# Patient Record
Sex: Male | Born: 1975 | Race: White | Hispanic: No | Smoking: Never smoker
Health system: Southern US, Community
[De-identification: ages and names within clinical notes are randomized; demographics above are authoritative.]

## PROBLEM LIST (undated history)

## (undated) DIAGNOSIS — G4700 Insomnia, unspecified: Secondary | ICD-10-CM

## (undated) DIAGNOSIS — F419 Anxiety disorder, unspecified: Secondary | ICD-10-CM

## (undated) DIAGNOSIS — I10 Essential (primary) hypertension: Secondary | ICD-10-CM

## (undated) DIAGNOSIS — F32A Depression, unspecified: Secondary | ICD-10-CM

## (undated) HISTORY — DX: Anxiety disorder, unspecified: F41.9

## (undated) HISTORY — DX: Essential (primary) hypertension: I10

## (undated) HISTORY — DX: Insomnia, unspecified: G47.00

## (undated) HISTORY — DX: Depression, unspecified: F32.A

---

## 2010-03-06 ENCOUNTER — Emergency Department (HOSPITAL_COMMUNITY)
Admission: EM | Admit: 2010-03-06 | Discharge: 2010-03-06 | Payer: Self-pay | Source: Home / Self Care | Admitting: Family Medicine

## 2010-03-16 ENCOUNTER — Inpatient Hospital Stay (INDEPENDENT_AMBULATORY_CARE_PROVIDER_SITE_OTHER)
Admission: RE | Admit: 2010-03-16 | Discharge: 2010-03-16 | Disposition: A | Discharge status: Home / Self Care | Payer: 59 | Source: Ambulatory Visit | Attending: Family Medicine | Admitting: Family Medicine

## 2010-03-16 DIAGNOSIS — Z0489 Encounter for examination and observation for other specified reasons: Secondary | ICD-10-CM

## 2011-11-21 IMAGING — CR DG NASAL BONES 3+V
3 series · 3 of 3 positions shown · non-contrast
Comparison: None.

CLINICAL DATA: Fell - laceration to nose

NASAL BONES - 3+ VIEW

[view not recorded (1 of 3)]
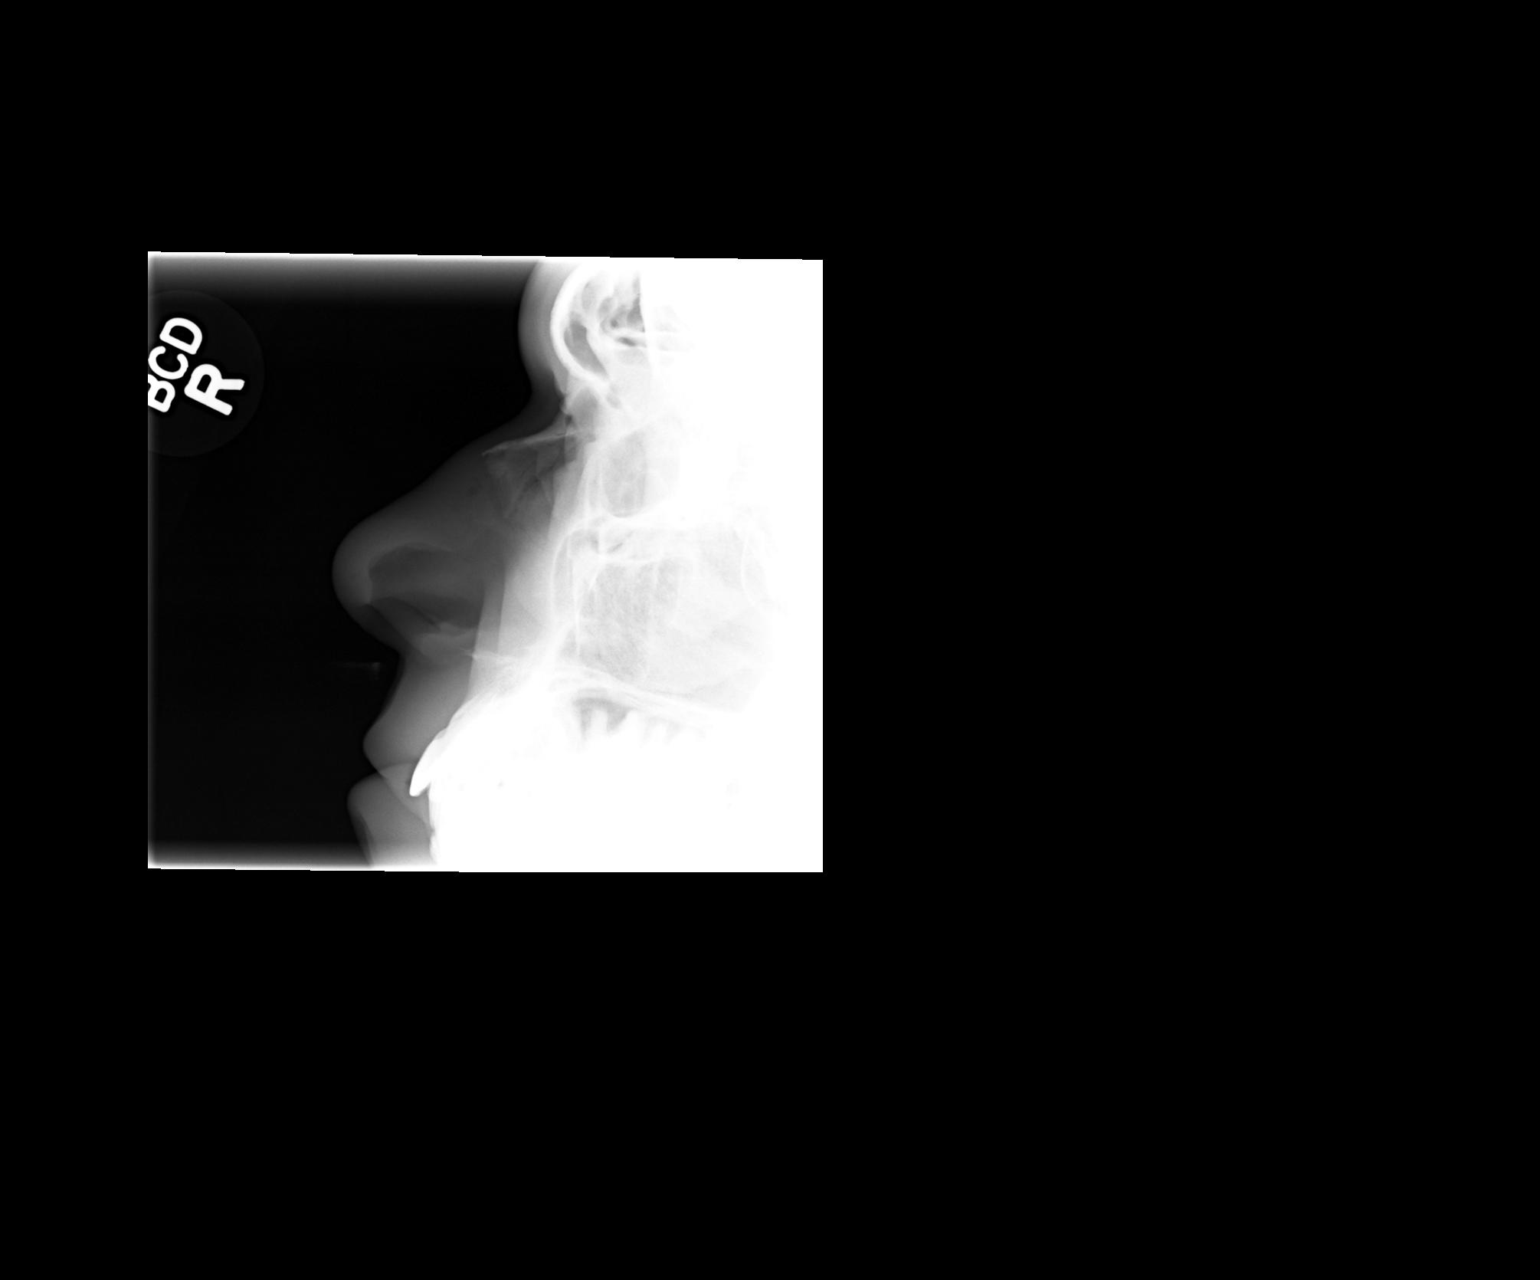

[view not recorded (2 of 3)]
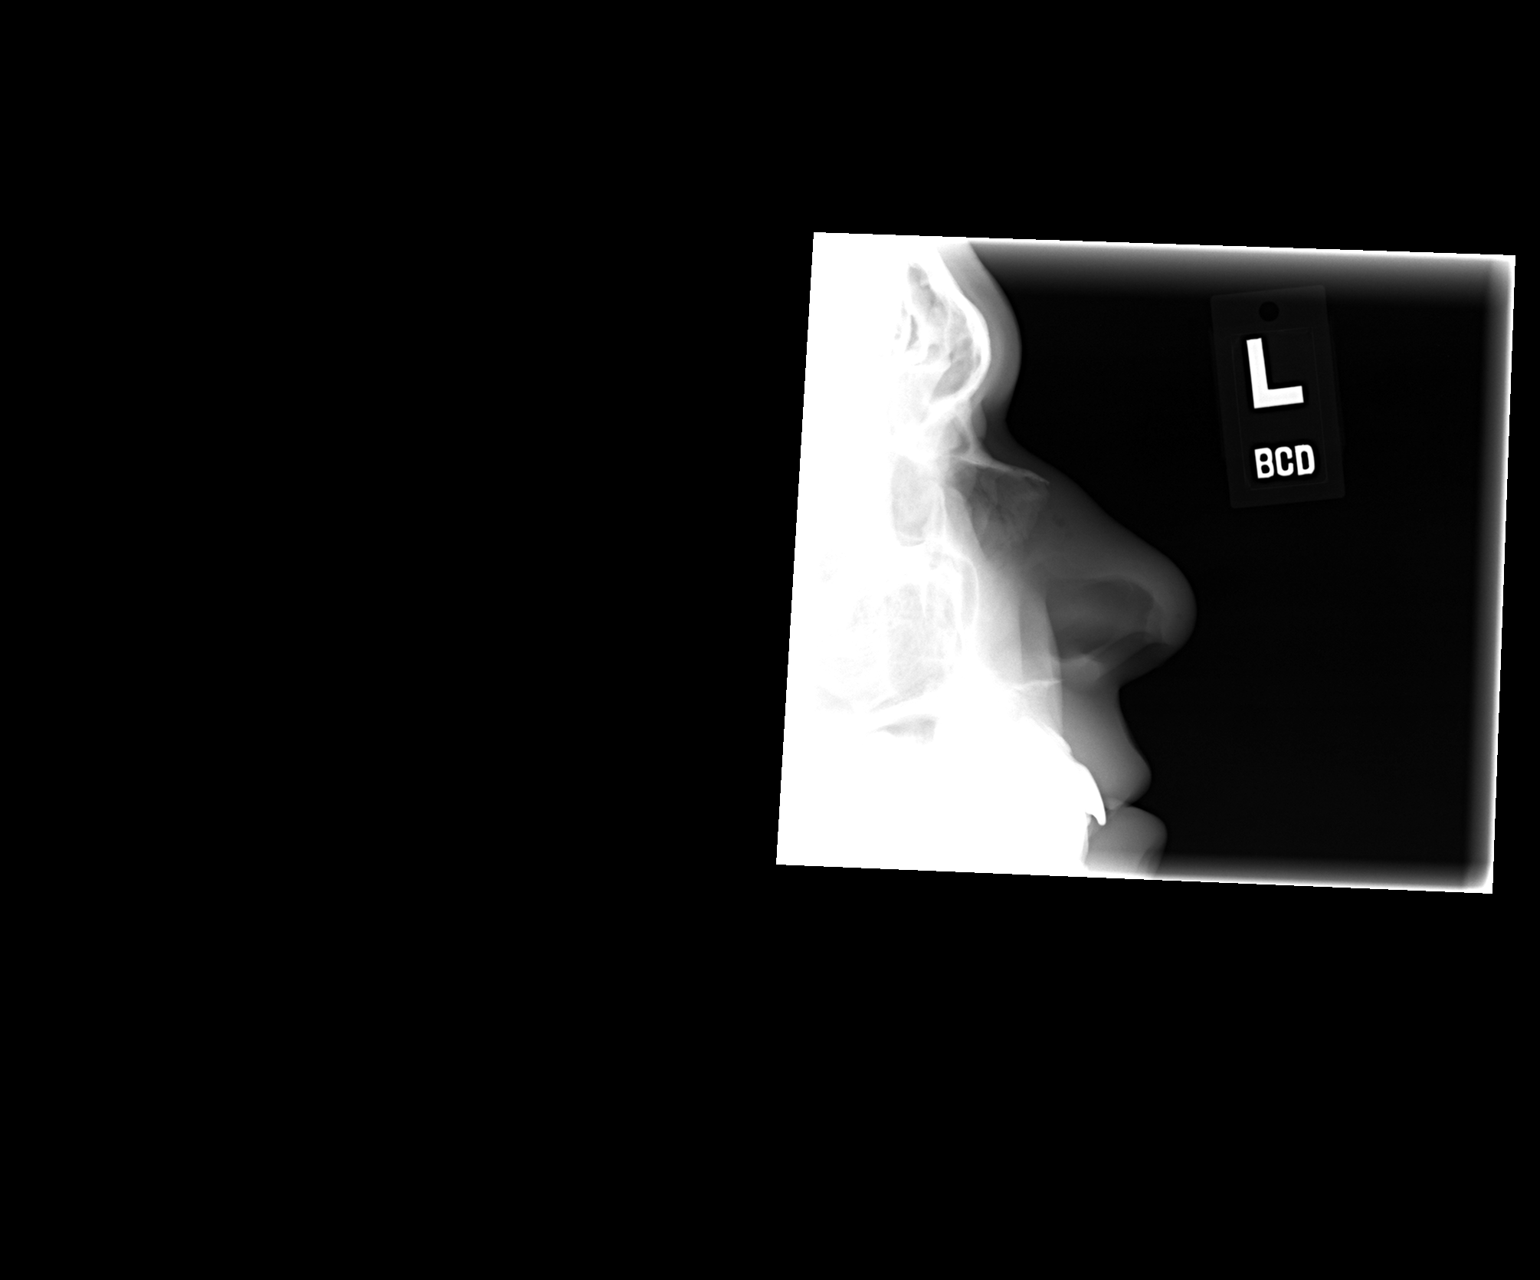

[view not recorded (3 of 3)]
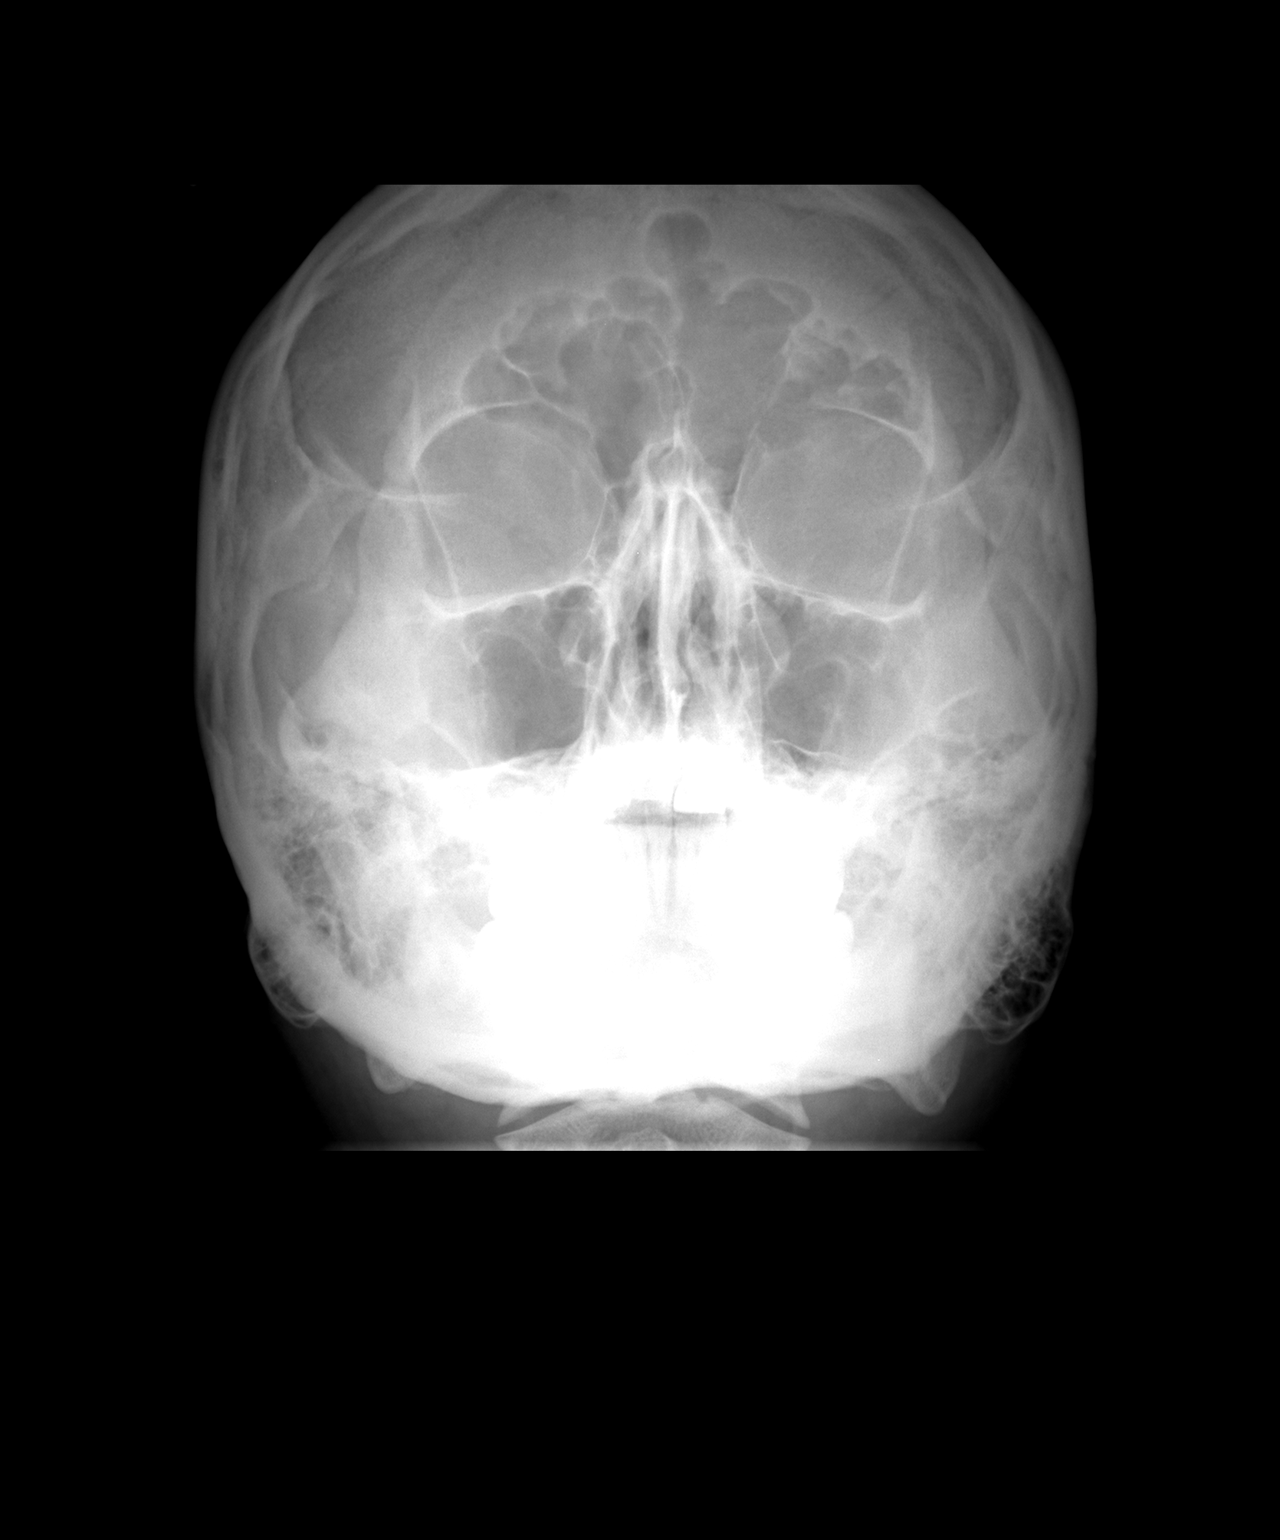

[3 of 3 positions shown; findings below may reference images not displayed]

FINDINGS: No nasal fracture.  No fluid in the sinuses.
IMPRESSION: No acute or significant findings.

## 2014-05-27 ENCOUNTER — Ambulatory Visit (INDEPENDENT_AMBULATORY_CARE_PROVIDER_SITE_OTHER): Payer: 59

## 2014-05-27 ENCOUNTER — Ambulatory Visit (INDEPENDENT_AMBULATORY_CARE_PROVIDER_SITE_OTHER): Payer: 59 | Admitting: Physician Assistant

## 2014-05-27 VITALS — BP 122/80 | HR 81 | Temp 97.8°F | Resp 20 | Ht 74.5 in | Wt 230.4 lb

## 2014-05-27 DIAGNOSIS — R05 Cough: Secondary | ICD-10-CM

## 2014-05-27 DIAGNOSIS — R059 Cough, unspecified: Secondary | ICD-10-CM

## 2014-05-27 DIAGNOSIS — R0989 Other specified symptoms and signs involving the circulatory and respiratory systems: Secondary | ICD-10-CM

## 2014-05-27 DIAGNOSIS — J302 Other seasonal allergic rhinitis: Secondary | ICD-10-CM | POA: Diagnosis not present

## 2014-05-27 LAB — POCT CBC
GRANULOCYTE PERCENT: 84.3 % — AB (ref 37–80)
HEMATOCRIT: 48.6 % (ref 43.5–53.7)
Hemoglobin: 16.3 g/dL (ref 14.1–18.1)
LYMPH, POC: 0.9 (ref 0.6–3.4)
MCH, POC: 32.3 pg — AB (ref 27–31.2)
MCHC: 33.5 g/dL (ref 31.8–35.4)
MCV: 96.3 fL (ref 80–97)
MID (CBC): 0.3 (ref 0–0.9)
MPV: 7.4 fL (ref 0–99.8)
PLATELET COUNT, POC: 230 10*3/uL (ref 142–424)
POC Granulocyte: 6.4 (ref 2–6.9)
POC LYMPH %: 12.2 % (ref 10–50)
POC MID %: 3.5 %M (ref 0–12)
RBC: 5.05 M/uL (ref 4.69–6.13)
RDW, POC: 14.2 %
WBC: 7.6 10*3/uL (ref 4.6–10.2)

## 2014-05-27 MED ORDER — GUAIFENESIN ER 1200 MG PO TB12: 1.0000 | ORAL_TABLET | Freq: Two times a day (BID) | ORAL | Status: DC | PRN

## 2014-05-27 MED ORDER — ALBUTEROL SULFATE HFA 108 (90 BASE) MCG/ACT IN AERS: 2.0000 | INHALATION_SPRAY | RESPIRATORY_TRACT | Status: DC | PRN

## 2014-05-27 MED ORDER — ALBUTEROL SULFATE (2.5 MG/3ML) 0.083% IN NEBU
2.5000 mg | INHALATION_SOLUTION | Freq: Once | RESPIRATORY_TRACT | Status: AC
  Administered 2014-05-27: 2.5 mg via RESPIRATORY_TRACT

## 2014-05-27 MED ORDER — HYDROCOD POLST-CPM POLST ER 10-8 MG/5ML PO SUER: 5.0000 mL | Freq: Every evening | ORAL | Status: AC | PRN

## 2014-05-27 NOTE — Patient Instructions (Signed)
Please continue to hydrate. Let us know if your symptoms do not improve in one week. Your breathing was much better with the albuterol which is why we are giving you an inhaler today.

## 2014-05-27 NOTE — Progress Notes (Signed)
[facility] [address], [place] [address] [phone]  Date:  05/27/2014   Name:  Matthew KeensDaniel Brining   DOB:  May 18, 1975   MRN:  [id number]  PCP:  Colette RibasGOLDING, JOHN CABOT, MD    Chief Complaint: Cough and Chills   History of Present Illness:  Matthew KeensDaniel Mcinerny is a 39 y.o. very pleasant male patient who presents with the following:  Patient reports that he began to have a cough that has progressively worsened last week.  The cough was productive yellowish-brown.  He also has congestion and sinus pressure.  He became concerned when yesterday he considerable dyspnea with walking up a flight of stairs.  This is not associated with chest pains, palpitations, dizziness, or diaphoresis.  He has some throat pain secondary to cough.  He notes that 1 month ago he had a cough and was given a Zpak, but only took 3 pills.  He denies nausea, vomiting, or diarrhea.  He has tried robitussin, contact cold and flu, mucinex.  Water intake 64oz per day.  He notes that he has been responding to the pollen and seasonal allergies in recent years.  There are no active problems to display for this patient.   Past Medical History  Diagnosis Date  . Anxiety   . Hypertension     History reviewed. No pertinent past surgical history.  History  Substance Use Topics  . Smoking status: Never Smoker   . Smokeless tobacco: Not on file  . Alcohol Use: Not on file    Family History  Problem Relation Age of Onset  . Diabetes Father   . Heart disease Father   . Hyperlipidemia Father     No Known Allergies  Medication list has been reviewed and updated.  No current outpatient prescriptions on file prior to visit.   No current facility-administered medications on file prior to visit.    Review of Systems: ROS otherwise unremarkable unless listed above.    Physical Examination: Filed Vitals:   05/27/14 1241  BP: 122/80  Pulse: 81  Temp: 97.8 F (36.6 C)  Resp: 20   Filed  Vitals:   05/27/14 1241  Height: 6' 2.5" (1.892 m)  Weight: 230 lb 6.4 oz (104.509 kg)   Body mass index is 29.2 kg/(m^2). Ideal Body Weight: Weight in (lb) to have BMI = 25: 196.9  Physical Exam  Constitutional: He appears well-developed and well-nourished. No distress.  HENT:  Head: Normocephalic and atraumatic.  Right Ear: External ear normal.  Nose: Mucosal edema and rhinorrhea present. Right sinus exhibits no maxillary sinus tenderness and no frontal sinus tenderness. Left sinus exhibits no maxillary sinus tenderness and no frontal sinus tenderness.  Mouth/Throat: No oropharyngeal exudate or posterior oropharyngeal edema.  Eyes: Conjunctivae and EOM are normal. Pupils are equal, round, and reactive to light. Right eye exhibits no discharge. Left eye exhibits no discharge.  Neck: Normal range of motion.  Cardiovascular: Normal rate and regular rhythm.  Exam reveals no gallop, no distant heart sounds and no friction rub.   No murmur heard. Pulmonary/Chest: No apnea. No respiratory distress. He has no decreased breath sounds. He has no wheezes. He has no rhonchi.  Skin: He is not diaphoretic.   Pre-nebulizing treatment: 630, predicted value 659 Post nebulizing treatment: 700  [facility] reading (PRIMARY) by  Dr. Cleta Albertsaub: Please comment on lateral of nodular density.  Also air trapping at anterior.    Assessment and Plan: 39 year old male is here today for  chief complaint of cough and chills. Likely that this is seasonal allergies that have prompted airway inflammation.  I am suggesting he start an albuterol inhaler.  He will start an allergy medication.  Supportive treatment for cough.    Cough - Plan: DG Chest 2 View, POCT CBC, chlorpheniramine-HYDROcodone (TUSSIONEX PENNKINETIC ER) 10-8 MG/5ML SUER  Seasonal allergies - Plan: albuterol (PROVENTIL HFA;VENTOLIN HFA) 108 (90 BASE) MCG/ACT inhaler  Chest congestion - Plan: Guaifenesin (MUCINEX MAXIMUM STRENGTH) 1200 MG TB12, albuterol  (PROVENTIL) (2.5 MG/3ML) 0.083% nebulizer solution 2.5 mg  Trena Platt, PA-C [facility] 4/18/20168:48 AM

## 2014-06-02 ENCOUNTER — Telehealth: Payer: Self-pay

## 2014-06-02 MED ORDER — AZITHROMYCIN 250 MG PO TABS: ORAL_TABLET | ORAL | Status: DC

## 2014-06-02 NOTE — Telephone Encounter (Signed)
As his symptoms have persisted for awhile now it is reasonable to treat with an antibiotic. Sent in ZPak. Take 2 pills on day 1 then one pill per day for 4 days. He needs to come in to be seen if his symptoms persist.

## 2014-06-02 NOTE — Telephone Encounter (Signed)
Patient says he was advised if his symptoms persisted that an antibiotic would be rx. He is still have the same symptoms

## 2014-06-02 NOTE — Telephone Encounter (Signed)
lmom to cb. 

## 2014-06-02 NOTE — Telephone Encounter (Signed)
Assessment and Plan: 39 year old male is here today for chief complaint of cough and chills. Likely that this is seasonal allergies that have prompted airway inflammation. I am suggesting he start an albuterol inhaler. He will start an allergy medication. Supportive treatment for cough.   Cough - Plan: DG Chest 2 View, POCT CBC, chlorpheniramine-HYDROcodone (TUSSIONEX PENNKINETIC ER) 10-8 MG/5ML SUER  Seasonal allergies - Plan: albuterol (PROVENTIL HFA;VENTOLIN HFA) 108 (90 BASE) MCG/ACT inhaler  Chest congestion - Plan: Guaifenesin (MUCINEX MAXIMUM STRENGTH) 1200 MG TB12, albuterol (PROVENTIL) (2.5 MG/3ML) 0.083% nebulizer solution 2.5 mg  I don't see in the plan that this was discussed. Please advise.

## 2014-06-03 NOTE — Telephone Encounter (Signed)
Left message letting pt know. 

## 2014-08-31 ENCOUNTER — Telehealth: Payer: Self-pay

## 2014-08-31 NOTE — Telephone Encounter (Signed)
Records have been received and are currently in [place] English, PA-C's box for review. Patient states he will call back at another time to schedule an appt.

## 2014-08-31 NOTE — Telephone Encounter (Signed)
Patient left voicemail today at 12:17pm stating he dropped off a ROI form about a week ago and wants to know if we have received records so he can set up an appt. Cb# [phone].

## 2014-09-01 ENCOUNTER — Ambulatory Visit (INDEPENDENT_AMBULATORY_CARE_PROVIDER_SITE_OTHER): Payer: 59 | Admitting: Physician Assistant

## 2014-09-01 VITALS — BP 142/100 | HR 76 | Temp 98.9°F | Resp 18 | Ht 73.5 in | Wt 227.0 lb

## 2014-09-01 DIAGNOSIS — F411 Generalized anxiety disorder: Secondary | ICD-10-CM | POA: Diagnosis not present

## 2014-09-01 DIAGNOSIS — F329 Major depressive disorder, single episode, unspecified: Secondary | ICD-10-CM | POA: Diagnosis not present

## 2014-09-01 DIAGNOSIS — F419 Anxiety disorder, unspecified: Secondary | ICD-10-CM | POA: Insufficient documentation

## 2014-09-01 DIAGNOSIS — I10 Essential (primary) hypertension: Secondary | ICD-10-CM | POA: Diagnosis not present

## 2014-09-01 DIAGNOSIS — F32A Depression, unspecified: Secondary | ICD-10-CM | POA: Insufficient documentation

## 2014-09-01 MED ORDER — BUPROPION HCL ER (SR) 100 MG PO TB12: 100.0000 mg | ORAL_TABLET | Freq: Every day | ORAL | Status: DC

## 2014-09-01 MED ORDER — NEBIVOLOL HCL 2.5 MG PO TABS: 5.0000 mg | ORAL_TABLET | Freq: Every day | ORAL | Status: DC

## 2014-09-01 MED ORDER — DIAZEPAM 5 MG PO TABS: 5.0000 mg | ORAL_TABLET | Freq: Four times a day (QID) | ORAL | Status: DC | PRN

## 2014-09-01 NOTE — Progress Notes (Signed)
   Subjective:    Patient ID: Matthew Hanson, male    DOB: 03/01/1975, 39 y.o.   MRN: [id number]  Chief Complaint  Patient presents with  . Medication Refills    Wellbutrin, Valium, and Bystolic. Just switched [place] to PCP   Patient Active Problem List   Diagnosis Date Noted  . Essential hypertension 09/01/2014  . Anxiety state 09/01/2014  . Depression 09/01/2014   Medications, allergies, past medical history, surgical history, family history, social history and problem list reviewed and updated.  HPI  18 yom presents needing refills.   Has been in [place] 2 yrs. His pcp just cut their weekend hours so he is transferring his care to [place].   Wellbutrin - Has been on for approx one year for depression. Used to take celexa but thought this made him feel drowsy and distant. Has felt depressed over past couple yrs due to stressful job and going through divorce. Takes qd in am.   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 Stressful. Takes prn. Sometimes not at all. Sometimes bid. Has not taken in one month.   Bystolic - BP mildly elevated today, normal last visit to clinic. Does not check at home. Had been running 130/80s at old pcp. Was exercising but has stopped recently. Not watching salt in diet.   Review of Systems No cp, sob, vision changes, palps, persistent HAs.     Objective:   Physical Exam  Constitutional: He appears well-developed and well-nourished.  Non-toxic appearance. He does not have a sickly appearance. He does not appear ill. No distress.  BP 142/100 mmHg  Pulse 76  Temp(Src) 98.9 F (37.2 C) (Oral)  Resp 18  Ht 6' 1.5" (1.867 m)  Wt 227 lb (102.967 kg)  BMI 29.54 kg/m2  SpO2 98%       Assessment & Plan:   Essential hypertension - Plan: nebivolol (BYSTOLIC) 2.5 MG tablet  Anxiety state - Plan: diazepam (VALIUM) 5 MG tablet  Depression - Plan: buPROPion (WELLBUTRIN SR) 100 MG 12 hr tablet --meds  filled for 3 month supply --for further refills pt encouraged to schedule a cpe at 104 when he knows his schedule, this will help to be established in the practice and to establish a pcp with [facility], pt agreeable --greater than 25 minutes spent with pt discussing his chronic conditions for which he takes his medications  Donnajean Lopes, PA-C Physician Assistant-Certified [facility]  09/01/2014 8:11 PM

## 2014-09-01 NOTE — Patient Instructions (Signed)
I've refilled your medications for 3 months. For further refills we would like to see you in the building next door ([address]) for a complete physical exam in 3 months. Please give [place] a call to schedule this ahead of time.

## 2014-11-28 ENCOUNTER — Other Ambulatory Visit: Payer: Self-pay

## 2014-11-28 DIAGNOSIS — I10 Essential (primary) hypertension: Secondary | ICD-10-CM

## 2014-11-28 MED ORDER — NEBIVOLOL HCL 5 MG PO TABS: 5.0000 mg | ORAL_TABLET | Freq: Every day | ORAL | Status: DC

## 2014-11-30 ENCOUNTER — Ambulatory Visit (INDEPENDENT_AMBULATORY_CARE_PROVIDER_SITE_OTHER): Payer: 59 | Admitting: Physician Assistant

## 2014-11-30 VITALS — BP 130/100 | HR 64 | Temp 99.2°F | Resp 18 | Ht 73.0 in | Wt 237.5 lb

## 2014-11-30 DIAGNOSIS — Z79899 Other long term (current) drug therapy: Secondary | ICD-10-CM | POA: Diagnosis not present

## 2014-11-30 DIAGNOSIS — Z0000 Encounter for general adult medical examination without abnormal findings: Secondary | ICD-10-CM | POA: Diagnosis not present

## 2014-11-30 LAB — POCT CBC
Granulocyte percent: 71.3 %G (ref 37–80)
HCT, POC: 49.5 % (ref 43.5–53.7)
Hemoglobin: 17 g/dL (ref 14.1–18.1)
Lymph, poc: 1.6 (ref 0.6–3.4)
MCH: 32.9 pg — AB (ref 27–31.2)
MCHC: 34.4 g/dL (ref 31.8–35.4)
MCV: 95.7 fL (ref 80–97)
MID (cbc): 0.4 (ref 0–0.9)
MPV: 8.3 fL (ref 0–99.8)
PLATELET COUNT, POC: 221 10*3/uL (ref 142–424)
POC Granulocyte: 4.9 (ref 2–6.9)
POC LYMPH PERCENT: 22.5 %L (ref 10–50)
POC MID %: 6.2 %M (ref 0–12)
RBC: 5.17 M/uL (ref 4.69–6.13)
RDW, POC: 13.9 %
WBC: 6.9 10*3/uL (ref 4.6–10.2)

## 2014-11-30 LAB — POCT GLYCOSYLATED HEMOGLOBIN (HGB A1C): Hemoglobin A1C: 5.4

## 2014-11-30 LAB — HEMOGLOBIN A1C: HEMOGLOBIN A1C: 5.4 % (ref 4.0–6.0)

## 2014-11-30 MED ORDER — BUPROPION HCL ER (SR) 150 MG PO TB12: 150.0000 mg | ORAL_TABLET | Freq: Two times a day (BID) | ORAL | Status: DC

## 2014-11-30 MED ORDER — BUPROPION HCL ER (XL) 150 MG PO TB24: 150.0000 mg | ORAL_TABLET | Freq: Every day | ORAL | Status: DC

## 2014-11-30 MED ORDER — CITALOPRAM HYDROBROMIDE 10 MG PO TABS: 5.0000 mg | ORAL_TABLET | Freq: Every day | ORAL | Status: DC

## 2014-11-30 MED ORDER — LORAZEPAM 1 MG PO TABS: 0.5000 mg | ORAL_TABLET | Freq: Three times a day (TID) | ORAL | Status: DC | PRN

## 2014-11-30 NOTE — Progress Notes (Signed)
11/30/2014 at 7:03 PM  Matthew Hanson / DOB: 04/27/1975 / MRN: [id number]  The patient has Essential hypertension; Anxiety state; and Depression on his problem list.  SUBJECTIVE  Matthew Hanson is a 39 y.o. male who complains of need for annual physical.  He has never smoked but reports passive smoking via his girlfriend who is actively trying to quit. Drinks roughly 5 EtOH beverages a week.   He has never been screened for HIV and syphilis in his lifetime.  His father is a diabetic and prostate cancer. Grandmother with history of breast cancer. He is physically inactive.     Patient would like to discuss his psychiatric medications.  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  Reports that he has 2-4 panic attacks a week in which he becomes sweaty and feels his heart race.  He feels paralyzed with worry during these episodes, which last anywhere from 5-10 minutes.  He has been taking Valium with little relief of his symptoms.  He takes Wellbutrin which was prescribed by his old PCP and reports that it has greatly helped him with concentration and energy with regard to work, and has also helped some with his mood.  He reports however that his anxiety has been somewhat worse since starting this med, and that life's stressors are not helping either.  He has tried Celexa 10 mg po daily and reports that he lost all motivation and had no anxiety, and was then moved to Wellbutrin.   He  has a past medical history of Anxiety and Hypertension.    Medications reviewed and updated by myself where necessary, and exist elsewhere in the encounter.   [name] has No Known Allergies. He  reports that he has never smoked. He does not have any smokeless tobacco history on file. He reports that he drinks about 3.0 oz of alcohol per week. He  has no sexual activity history on  file. The patient  has no past surgical history on file.  His family history includes Diabetes in his father; Heart disease in his father; Hyperlipidemia in his father.  Review of Systems  Constitutional: Negative for fever.  Eyes: Negative.   Respiratory: Negative.   Cardiovascular: Negative.  Negative for chest pain and palpitations.  Gastrointestinal: Negative for nausea, vomiting, abdominal pain, diarrhea and constipation.  Genitourinary: Negative for dysuria, urgency and frequency.  Musculoskeletal: Negative for myalgias, back pain and falls.  Skin: Negative for itching and rash.  Neurological: Negative for dizziness, tingling and headaches.  Endo/Heme/Allergies: Negative for polydipsia.  Psychiatric/Behavioral: Negative for depression, suicidal ideas, hallucinations, memory loss and substance abuse. The patient is nervous/anxious. The patient does not have insomnia.     OBJECTIVE  His  height is  (1.854 m) and weight is 237 lb 8 oz (107.729 kg). His oral temperature is 99.2 F (37.3 C). His blood pressure is 130/100 and his pulse is 64. His respiration is 18 and oxygen saturation is 97%.  The patient's body mass index is 31.34 kg/(m^2).  Physical Exam  Vitals reviewed. Constitutional: He is oriented to person, place, and time. He appears well-developed. No distress.  Eyes: EOM are normal. Pupils are equal, round, and reactive to light. No scleral icterus.  Neck: Normal range of motion.  Cardiovascular: Normal rate and regular rhythm.   Respiratory: Effort normal and breath sounds normal.  GI: He exhibits no distension.  Musculoskeletal: Normal range of motion.  Neurological: He is alert and oriented to person, place, and time. No cranial nerve deficit.  Skin: Skin is warm and dry. No rash noted. He is not diaphoretic.  Psychiatric: His behavior is normal. Judgment and thought content normal. His mood appears anxious. His affect is not angry, not blunt, not labile and not  inappropriate. His speech is not rapid and/or pressured, not delayed, not tangential and not slurred. He is not agitated, not aggressive, not hyperactive, not slowed, not withdrawn, not actively hallucinating and not combative. Thought content is not paranoid and not delusional. Cognition and memory are not impaired. He does not express impulsivity or inappropriate judgment. He exhibits a depressed mood. He expresses no homicidal and no suicidal ideation. He expresses no suicidal plans and no homicidal plans. He is communicative. He exhibits normal recent memory and normal remote memory. He is attentive.    Results for orders placed or performed in visit on 11/30/14 (from the past 24 hour(s))  POCT glycosylated hemoglobin (Hb A1C)     Status: None   Collection Time: 11/30/14  6:47 PM  Result Value Ref Range   Hemoglobin A1C 5.4     ASSESSMENT & PLAN  Reuel BoomDaniel was seen today for annual exam and flu vaccine.  Diagnoses and all orders for this visit:  Annual physical exam -     POCT glycosylated hemoglobin (Hb A1C) -     POCT CBC -     Lipid panel -     TSH -     Comprehensive metabolic panel -     HIV antibody -     RPR  Medication management: Mechanisms of action of Wellbutrin and SSRI's discussed with patient.  Advised that benzodiazepines will not prevent anxiety attacks but only treat them.  Advised that Wellbutrin could be contributing to his anxious affect.  Advised adding low dose Celexa to strike a balance between serotonin, dopamine, and norepinephrine.  Low dose benzo to bridge SSRI therapy.  He is to call in 2-3 weeks and leave a message with regard to how he is doing.  I will call him back at that time.   -     Discontinue: buPROPion (WELLBUTRIN SR) 150 MG 12 hr tablet; Take 1 tablet (150 mg total) by mouth 2 (two) times daily. -     citalopram (CELEXA) 10 MG tablet; Take 0.5 tablets (5 mg total) by mouth daily. -     LORazepam (ATIVAN) 1 MG tablet; Take 0.5-1 tablets (0.5-1 mg  total) by mouth every 8 (eight) hours as needed for anxiety. -     buPROPion (WELLBUTRIN XL) 150 MG 24 hr tablet; Take 1 tablet (150 mg total) by mouth daily.   The patient was advised to call or come back to clinic if he does not see an improvement in symptoms, or worsens with the above plan.   Deliah BostonMichael Clark, MHS, PA-C [facility] 11/30/2014 7:03 PM

## 2014-12-01 LAB — TSH: TSH: 1.809 u[IU]/mL (ref 0.350–4.500)

## 2014-12-01 LAB — LIPID PANEL
CHOL/HDL RATIO: 4.5 ratio (ref <=5.0)
CHOLESTEROL: 269 mg/dL — AB (ref 125–200)
HDL: 60 mg/dL (ref >=40)
LDL Cholesterol: 161 mg/dL — ABNORMAL HIGH (ref <130)
Triglycerides: 240 mg/dL — ABNORMAL HIGH (ref <150)
VLDL: 48 mg/dL — ABNORMAL HIGH (ref <30)

## 2014-12-01 LAB — COMPREHENSIVE METABOLIC PANEL WITH GFR
ALT: 41 U/L (ref 9–46)
AST: 48 U/L — ABNORMAL HIGH (ref 10–40)
Albumin: 4.5 g/dL (ref 3.6–5.1)
Alkaline Phosphatase: 87 U/L (ref 40–115)
BUN: 11 mg/dL (ref 7–25)
CO2: 26 mmol/L (ref 20–31)
Calcium: 9.7 mg/dL (ref 8.6–10.3)
Chloride: 99 mmol/L (ref 98–110)
Creat: 1.1 mg/dL (ref 0.60–1.35)
Glucose, Bld: 106 mg/dL — ABNORMAL HIGH (ref 65–99)
Potassium: 4.2 mmol/L (ref 3.5–5.3)
Sodium: 135 mmol/L (ref 135–146)
Total Bilirubin: 0.6 mg/dL (ref 0.2–1.2)
Total Protein: 7.1 g/dL (ref 6.1–8.1)

## 2014-12-01 LAB — HIV ANTIBODY (ROUTINE TESTING W REFLEX): HIV 1&2 Ab, 4th Generation: NONREACTIVE

## 2014-12-02 LAB — RPR

## 2014-12-09 ENCOUNTER — Encounter: Payer: Self-pay | Admitting: Family Medicine

## 2014-12-19 ENCOUNTER — Other Ambulatory Visit: Payer: Self-pay | Admitting: Physician Assistant

## 2014-12-19 DIAGNOSIS — I10 Essential (primary) hypertension: Secondary | ICD-10-CM

## 2014-12-19 MED ORDER — NEBIVOLOL HCL 5 MG PO TABS: 5.0000 mg | ORAL_TABLET | Freq: Every day | ORAL | Status: DC

## 2014-12-30 ENCOUNTER — Other Ambulatory Visit: Payer: Self-pay | Admitting: Physician Assistant

## 2015-03-13 ENCOUNTER — Other Ambulatory Visit: Payer: Self-pay | Admitting: Physician Assistant

## 2015-05-19 ENCOUNTER — Other Ambulatory Visit: Payer: Self-pay | Admitting: Physician Assistant

## 2015-07-20 ENCOUNTER — Ambulatory Visit (INDEPENDENT_AMBULATORY_CARE_PROVIDER_SITE_OTHER): Payer: 59 | Admitting: Physician Assistant

## 2015-07-20 ENCOUNTER — Other Ambulatory Visit: Payer: Self-pay | Admitting: Physician Assistant

## 2015-07-20 VITALS — BP 138/88 | HR 70 | Temp 98.9°F | Resp 18 | Ht 73.0 in | Wt 249.0 lb

## 2015-07-20 DIAGNOSIS — J069 Acute upper respiratory infection, unspecified: Secondary | ICD-10-CM

## 2015-07-20 MED ORDER — BENZONATATE 100 MG PO CAPS: 100.0000 mg | ORAL_CAPSULE | Freq: Three times a day (TID) | ORAL | Status: DC | PRN

## 2015-07-20 MED ORDER — HYDROCOD POLST-CPM POLST ER 10-8 MG/5ML PO SUER: 5.0000 mL | Freq: Two times a day (BID) | ORAL | Status: DC | PRN

## 2015-07-20 MED ORDER — AMOXICILLIN-POT CLAVULANATE 875-125 MG PO TABS: 1.0000 | ORAL_TABLET | Freq: Two times a day (BID) | ORAL | Status: AC

## 2015-07-20 NOTE — Progress Notes (Signed)
[facility] [address], [place] [address] [phone]  Date:  07/20/2015   Name:  Matthew Hanson   DOB:  [birth year]   MRN:  [id number]  PCP:  Colette Ribas, MD    Chief Complaint: Cough; Sinusitis; and Fatigue   History of Present Illness:  This is a 40 y.o. male with PMH HTN, anxiety, depression who is presenting with cough, nasal congestion, fatigue x 4 days. States he is hearing "rattling" in his chest. Having head pressure. Coughing a lot at night and not sleeping well. States a lot of people at work are sick. Cough is productive. Feeling sob with going up stairs. Mild wheezing occasionally. +sore throat from coughing. He is unsure if he has had any fevers - no hot flashes or chills. Pt is very worried about getting a sinus infection -- states every time he has an illness like this he ends up with a sinus infection.  Aggravating/alleviating factors: robitussin with mucinex but not helping History of asthma: no History of env allergies: mild Tobacco use: no  Review of Systems:  Review of Systems See HPI  Patient Active Problem List   Diagnosis Date Noted  . Essential hypertension 09/01/2014  . Anxiety state 09/01/2014  . Depression 09/01/2014    Prior to Admission medications   Medication Sig Start Date End Date Taking? Authorizing Provider  buPROPion (WELLBUTRIN XL) 150 MG 24 hr tablet Take 1 tablet (150 mg total) by mouth daily. 11/30/14  Yes Ofilia Neas, PA-C  citalopram (CELEXA) 10 MG tablet Take 0.5 tablets (5 mg total) by mouth daily. 11/30/14  Yes Ofilia Neas, PA-C  LORazepam (ATIVAN) 1 MG tablet Take 0.5-1 tablets (0.5-1 mg total) by mouth every 8 (eight) hours as needed for anxiety. 11/30/14  Yes Ofilia Neas, PA-C  nebivolol (BYSTOLIC) 5 MG tablet Take 1 tablet (5 mg total) by mouth daily. 12/19/14  Yes Ofilia Neas, PA-C    No Known Allergies  History reviewed. No pertinent past surgical history.  Social History   Substance Use Topics  . Smoking status: Never Smoker   . Smokeless tobacco: None  . Alcohol Use: 3.0 oz/week    5 Standard drinks or equivalent per week    Family History  Problem Relation Age of Onset  . Diabetes Father   . Heart disease Father   . Hyperlipidemia Father     Medication list has been reviewed and updated.  Physical Examination:  Physical Exam  Constitutional: He is oriented to person, place, and time. He appears well-developed and well-nourished. No distress.  HENT:  Head: Normocephalic and atraumatic.  Right Ear: Hearing, tympanic membrane, external ear and ear canal normal.  Left Ear: Hearing, tympanic membrane, external ear and ear canal normal.  Nose: Nose normal. Right sinus exhibits no maxillary sinus tenderness and no frontal sinus tenderness. Left sinus exhibits no maxillary sinus tenderness and no frontal sinus tenderness.  Mouth/Throat: Uvula is midline, oropharynx is clear and moist and mucous membranes are normal.  Eyes: Conjunctivae and lids are normal. Right eye exhibits no discharge. Left eye exhibits no discharge. No scleral icterus.  Cardiovascular: Normal rate, regular rhythm, normal heart sounds and normal pulses.   No murmur heard. Pulmonary/Chest: Effort normal and breath sounds normal. No respiratory distress. He has no wheezes. He has no rhonchi. He has no rales.  Musculoskeletal: Normal range of motion.  Lymphadenopathy:       Head (right side): No submental, no submandibular  and no tonsillar adenopathy present.       Head (left side): No submental, no submandibular and no tonsillar adenopathy present.    He has no cervical adenopathy.  Neurological: He is alert and oriented to person, place, and time.  Skin: Skin is warm, dry and intact. No lesion and no rash noted.  Psychiatric: He has a normal mood and affect. His speech is normal and behavior is normal. Thought content normal.   BP 138/88 mmHg  Pulse 70  Temp(Src) 98.9 F (37.2  C) (Oral)  Resp 18  Ht 6\' 1"  (1.854 m)  Wt 249 lb (112.946 kg)  BMI 32.86 kg/m2  SpO2 96%  Assessment and Plan:  1. Viral URI Exam and vitals normal. I suspect viral etiology. He will treat with tussionex, tessalon and afrin (for 3 days). I did give him an abx print out as he was very worried he would end up with a sinus infection. We discussed prevention. If not getting better in 3 days, he will fill the abx. Return in 10 days if symptoms do not improve or at any time if symptoms worsen.  - chlorpheniramine-HYDROcodone (TUSSIONEX PENNKINETIC ER) 10-8 MG/5ML SUER; Take 5 mLs by mouth every 12 (twelve) hours as needed for cough.  Dispense: 100 mL; Refill: 0 - benzonatate (TESSALON) 100 MG capsule; Take 1-2 capsules (100-200 mg total) by mouth 3 (three) times daily as needed for cough.  Dispense: 40 capsule; Refill: 0 - amoxicillin-clavulanate (AUGMENTIN) 875-125 MG tablet; Take 1 tablet by mouth 2 (two) times daily. MAY FILL 07/23/15 - 07/30/15 ONLY  Dispense: 20 tablet; Refill: 0   Roswell MinersNicole V. Dyke BrackettBush, PA-C, MHS [facility]  07/20/2015

## 2015-07-20 NOTE — Patient Instructions (Addendum)
Drink plenty of water (64 oz/day) and get plenty of rest. If you have been prescribed a cough syrup, do not drive or operate heavy machinery while using this medication. Take tessalon during the day Buy afrin nasal spray and use twice a day for 3 days. Eat spicy food, breath in hot steam from shower. If your symptoms are not improving in 3 days, may fill antibiotic    IF you received an x-ray today, you will receive an invoice from [facility]. Please contact [facility] at [phone] with questions or concerns regarding your invoice.   IF you received labwork today, you will receive an invoice from [facility]. Please contact [facility] at [phone] with questions or concerns regarding your invoice.   Our billing staff will not be able to assist you with questions regarding bills from these companies.  You will be contacted with the lab results as soon as they are available. The fastest way to get your results is to activate your My Chart account. Instructions are located on the last page of this paperwork. If you have not heard from us regarding the results in 2 weeks, please contact this office.

## 2015-07-21 NOTE — Telephone Encounter (Signed)
I need to see him back to see how he is doing.  Deliah BostonMichael Clark, MS, PA-C 8:20 AM, 07/21/2015

## 2015-08-24 ENCOUNTER — Ambulatory Visit (INDEPENDENT_AMBULATORY_CARE_PROVIDER_SITE_OTHER): Payer: 59 | Admitting: Physician Assistant

## 2015-08-24 VITALS — BP 138/86 | HR 81 | Temp 98.0°F | Resp 16 | Ht 74.0 in | Wt 245.0 lb

## 2015-08-24 DIAGNOSIS — S61218A Laceration without foreign body of other finger without damage to nail, initial encounter: Secondary | ICD-10-CM

## 2015-08-24 DIAGNOSIS — S61213A Laceration without foreign body of left middle finger without damage to nail, initial encounter: Secondary | ICD-10-CM | POA: Diagnosis not present

## 2015-08-24 NOTE — Patient Instructions (Addendum)
IF you received an x-ray today, you will receive an invoice from [facility]. Please contact [facility] at [phone] with questions or concerns regarding your invoice.   IF you received labwork today, you will receive an invoice from [facility]. Please contact [facility] at [phone] with questions or concerns regarding your invoice.   Our billing staff will not be able to assist you with questions regarding bills from these companies.  You will be contacted with the lab results as soon as they are available. The fastest way to get your results is to activate your My Chart account. Instructions are located on the last page of this paperwork. If you have not heard from us regarding the results in 2 weeks, please contact this office.    Please return in 7 days for suture removal. Laceration Care, Adult A laceration is a cut that goes through all layers of the skin. The cut also goes into the tissue that is right under the skin. Some cuts heal on their own. Others need to be closed with stitches (sutures), staples, skin adhesive strips, or wound glue. Taking care of your cut lowers your risk of infection and helps your cut to heal better. HOW TO TAKE CARE OF YOUR CUT For stitches or staples:  Keep the wound clean and dry.  If you were given a bandage (dressing), you should change it at least one time per day or as told by your doctor. You should also change it if it gets wet or dirty.  Keep the wound completely dry for the first 24 hours or as told by your doctor. After that time, you may take a shower or a bath. However, make sure that the wound is not soaked in water until after the stitches or staples have been removed.  Clean the wound one time each day or as told by your doctor:  Wash the wound with soap and water.  Rinse the wound with water until all of the soap comes off.  Pat the wound dry with a clean towel. Do not rub the  wound.  After you clean the wound, put a thin layer of antibiotic ointment on it as told by your doctor. This ointment:  Helps to prevent infection.  Keeps the bandage from sticking to the wound.  Have your stitches or staples removed as told by your doctor. If your doctor used skin adhesive strips:   Keep the wound clean and dry.  If you were given a bandage, you should change it at least one time per day or as told by your doctor. You should also change it if it gets dirty or wet.  Do not get the skin adhesive strips wet. You can take a shower or a bath, but be careful to keep the wound dry.  If the wound gets wet, pat it dry with a clean towel. Do not rub the wound.  Skin adhesive strips fall off on their own. You can trim the strips as the wound heals. Do not remove any strips that are still stuck to the wound. They will fall off after a while. If your doctor used wound glue:  Try to keep your wound dry, but you may briefly wet it in the shower or bath. Do not soak the wound in water, such as by swimming.  After you take a shower or a bath, gently pat the wound dry with a clean towel. Do not rub the wound.  Do  not do any activities that will make you really sweaty until the skin glue has fallen off on its own.  Do not apply liquid, cream, or ointment medicine to your wound while the skin glue is still on.  If you were given a bandage, you should change it at least one time per day or as told by your doctor. You should also change it if it gets dirty or wet.  If a bandage is placed over the wound, do not let the tape for the bandage touch the skin glue.  Do not pick at the glue. The skin glue usually stays on for 5-10 days. Then, it falls off of the skin. General Instructions  To help prevent scarring, make sure to cover your wound with sunscreen whenever you are outside after stitches are removed, after adhesive strips are removed, or when wound glue stays in place and the  wound is healed. Make sure to wear a sunscreen of at least 30 SPF.  Take over-the-counter and prescription medicines only as told by your doctor.  If you were given antibiotic medicine or ointment, take or apply it as told by your doctor. Do not stop using the antibiotic even if your wound is getting better.  Do not scratch or pick at the wound.  Keep all follow-up visits as told by your doctor. This is important.  Check your wound every day for signs of infection. Watch for:  Redness, swelling, or pain.  Fluid, blood, or pus.  Raise (elevate) the injured area above the level of your heart while you are sitting or lying down, if possible. GET HELP IF:  You got a tetanus shot and you have any of these problems at the injection site:  Swelling.  Very bad pain.  Redness.  Bleeding.  You have a fever.  A wound that was closed breaks open.  You notice a bad smell coming from your wound or your bandage.  You notice something coming out of the wound, such as wood or glass.  Medicine does not help your pain.  You have more redness, swelling, or pain at the site of your wound.  You have fluid, blood, or pus coming from your wound.  You notice a change in the color of your skin near your wound.  You need to change the bandage often because fluid, blood, or pus is coming from the wound.  You start to have a new rash.  You start to have numbness around the wound. GET HELP RIGHT AWAY IF:  You have very bad swelling around the wound.  Your pain suddenly gets worse and is very bad.  You notice painful lumps near the wound or on skin that is anywhere on your body.  You have a red streak going away from your wound.  The wound is on your hand or foot and you cannot move a finger or toe like you usually can.  The wound is on your hand or foot and you notice that your fingers or toes look pale or bluish.   This information is not intended to replace advice given to you by  your health care provider. Make sure you discuss any questions you have with your health care provider.   Document Released: 07/17/2007 Document Revised: 06/14/2014 Document Reviewed: 01/24/2014 Elsevier Interactive Patient Education [employer]

## 2015-09-09 NOTE — Progress Notes (Signed)
[facility] [address], [place] [address] [id number] [phone]  Date:  08/24/2015   Name:  Matthew Hanson   DOB:  [birth year]   MRN:  [id number]  PCP:  Colette Ribas, MD    History of Present Illness:  Matthew Hanson is a 40 y.o. male patient who presents to [facility] for cc of left middle finger laceration. --rinsed off and covered. --tdap uptodate       Patient Active Problem List   Diagnosis Date Noted  . Essential hypertension 09/01/2014  . Anxiety state 09/01/2014  . Depression 09/01/2014    Past Medical History:  Diagnosis Date  . Anxiety   . Hypertension     History reviewed. No pertinent surgical history.  Social History  Substance Use Topics  . Smoking status: Never Smoker  . Smokeless tobacco: Not on file  . Alcohol use 3.0 oz/week    5 Standard drinks or equivalent per week    Family History  Problem Relation Age of Onset  . Diabetes Father   . Heart disease Father   . Hyperlipidemia Father     No Known Allergies  Medication list has been reviewed and updated.  Current Outpatient Prescriptions on File Prior to Visit  Medication Sig Dispense Refill  . buPROPion (WELLBUTRIN XL) 150 MG 24 hr tablet Take 1 tablet (150 mg total) by mouth daily. 90 tablet 3  . nebivolol (BYSTOLIC) 5 MG tablet Take 1 tablet (5 mg total) by mouth daily. 30 tablet 6  . benzonatate (TESSALON) 100 MG capsule Take 1-2 capsules (100-200 mg total) by mouth 3 (three) times daily as needed for cough. (Patient not taking: Reported on 08/24/2015) 40 capsule 0  . chlorpheniramine-HYDROcodone (TUSSIONEX PENNKINETIC ER) 10-8 MG/5ML SUER Take 5 mLs by mouth every 12 (twelve) hours as needed for cough. (Patient not taking: Reported on 08/24/2015) 100 mL 0  . citalopram (CELEXA) 10 MG tablet Take 0.5 tablets (5 mg total) by mouth daily. (Patient not taking: Reported on 08/24/2015) 45 tablet 3  . LORazepam (ATIVAN) 1 MG tablet Take 0.5-1 tablets (0.5-1 mg total) by mouth  every 8 (eight) hours as needed for anxiety. (Patient not taking: Reported on 08/24/2015) 30 tablet 1   No current facility-administered medications on file prior to visit.     ROS ROS otherwise unremarkable unless listed above.   Physical Examination: BP 138/86   Pulse 81   Temp 98 F (36.7 C)   Resp 16   Ht  (1.88 m)   Wt 245 lb (111.1 kg)   SpO2 95%   BMI 31.46 kg/m  Ideal Body Weight: Weight in (lb) to have BMI = 25: 194.3  Physical Exam  Constitutional: He is oriented to person, place, and time. He appears well-developed and well-nourished. No distress.  HENT:  Head: Normocephalic and atraumatic.  Eyes: Conjunctivae and EOM are normal. Pupils are equal, round, and reactive to light.  Cardiovascular: Normal rate.   Pulmonary/Chest: Effort normal. No respiratory distress.  Neurological: He is alert and oriented to person, place, and time.  Skin: Skin is warm and dry. He is not diaphoretic.  Left middle finger with palmar oblique laceration extending about 1cm across.  Shallow laceration just proximal.    Psychiatric: He has a normal mood and affect. His behavior is normal.   Procedure: verbal consent obtained.  .5% bupivicaine applied to the laceration site.  5-0 ethilon utilized to place 2 simple interrupted sutures to the 1cm laceration.  Cleansed  with normal saline.  Dressings applied.   Assessment and Plan: Matthew Hanson is a 40 y.o. male who is here today for cc of laceration.   Wound care discussed. rtc in 7 days for suture removal.  Alarming symptoms to warrant an immediate return discussed.   Laceration of middle finger, initial encounter   Trena Platt, PA-C [facility] 09/09/2015 7:39 AM

## 2015-09-12 ENCOUNTER — Encounter: Payer: Self-pay | Admitting: Physician Assistant

## 2015-09-12 DIAGNOSIS — F410 Panic disorder [episodic paroxysmal anxiety] without agoraphobia: Secondary | ICD-10-CM | POA: Insufficient documentation

## 2015-11-09 ENCOUNTER — Ambulatory Visit (INDEPENDENT_AMBULATORY_CARE_PROVIDER_SITE_OTHER): Payer: 59 | Admitting: Physician Assistant

## 2015-11-09 VITALS — BP 150/100 | HR 71 | Temp 98.6°F | Resp 16 | Ht 73.0 in | Wt 240.0 lb

## 2015-11-09 DIAGNOSIS — F418 Other specified anxiety disorders: Secondary | ICD-10-CM

## 2015-11-09 DIAGNOSIS — IMO0001 Reserved for inherently not codable concepts without codable children: Secondary | ICD-10-CM

## 2015-11-09 DIAGNOSIS — F329 Major depressive disorder, single episode, unspecified: Secondary | ICD-10-CM

## 2015-11-09 DIAGNOSIS — R21 Rash and other nonspecific skin eruption: Secondary | ICD-10-CM

## 2015-11-09 DIAGNOSIS — N489 Disorder of penis, unspecified: Secondary | ICD-10-CM

## 2015-11-09 DIAGNOSIS — K625 Hemorrhage of anus and rectum: Secondary | ICD-10-CM

## 2015-11-09 DIAGNOSIS — R030 Elevated blood-pressure reading, without diagnosis of hypertension: Secondary | ICD-10-CM | POA: Diagnosis not present

## 2015-11-09 DIAGNOSIS — I10 Essential (primary) hypertension: Secondary | ICD-10-CM

## 2015-11-09 DIAGNOSIS — F32A Depression, unspecified: Secondary | ICD-10-CM

## 2015-11-09 DIAGNOSIS — F419 Anxiety disorder, unspecified: Secondary | ICD-10-CM

## 2015-11-09 DIAGNOSIS — Z79899 Other long term (current) drug therapy: Secondary | ICD-10-CM | POA: Diagnosis not present

## 2015-11-09 MED ORDER — DOXYCYCLINE HYCLATE 100 MG PO CAPS: 100.0000 mg | ORAL_CAPSULE | Freq: Two times a day (BID) | ORAL | 0 refills | Status: AC

## 2015-11-09 MED ORDER — NEBIVOLOL HCL 5 MG PO TABS: 5.0000 mg | ORAL_TABLET | Freq: Every day | ORAL | 6 refills | Status: DC

## 2015-11-09 MED ORDER — LORAZEPAM 1 MG PO TABS: 0.5000 mg | ORAL_TABLET | Freq: Three times a day (TID) | ORAL | 1 refills | Status: DC | PRN

## 2015-11-09 MED ORDER — BUPROPION HCL 75 MG PO TABS: 75.0000 mg | ORAL_TABLET | ORAL | 3 refills | Status: DC

## 2015-11-09 MED ORDER — HYDROCORTISONE ACETATE 25 MG RE SUPP: 25.0000 mg | Freq: Two times a day (BID) | RECTAL | 0 refills | Status: DC

## 2015-11-09 MED ORDER — CITALOPRAM HYDROBROMIDE 10 MG PO TABS: 5.0000 mg | ORAL_TABLET | Freq: Every day | ORAL | 3 refills | Status: DC

## 2015-11-09 NOTE — Progress Notes (Signed)
11/10/2015 1:15 PM   DOB: 10-Feb-1976 / MRN: [id number]  SUBJECTIVE:  Matthew Hanson is a 40 y.o. male presenting for bumps on his scrotum that started about two months ago.  Reports the bumps are painful, however, will pop and then become pain free.   Complains of rectal bleeding along with rectal irritation.  He has had two episodes of bleeding.  Denies NSAID use, GERD, cough, throat irritation, abdominal pain.    Wants his medication refilled today.  Takes Nevolol for HTN.  No symptoms today (see ROS).  Is willing to keep a log and bring this by later for med titration.   Has a history of anxiety and depression.  Takes wellbutin daily and would like more lorazepam for panic symptoms that occur about one to two times per month.  Has a brother addicted to benzos and does not want this to happen to him.  Is willing to go back on Citalopram daily to try and reduce his need for rescue meds.    Depression screen [facility] 2/9 11/09/2015  Decreased Interest 0  Down, Depressed, Hopeless 0  PHQ - 2 Score 0     He has No Known Allergies.   He  has a past medical history of Anxiety and Hypertension.    He  reports that he has never smoked. He has never used smokeless tobacco. He reports that he drinks about 3.0 oz of alcohol per week . He reports that he does not use drugs. He  has no sexual activity history on file. The patient  has no past surgical history on file.  His family history includes Diabetes in his father; Heart disease in his father; Hyperlipidemia in his father.  Review of Systems  Constitutional: Negative for chills and fever.  Eyes: Negative for blurred vision.  Respiratory: Negative for cough and shortness of breath.   Cardiovascular: Negative for chest pain.  Gastrointestinal: Negative for abdominal pain and nausea.  Genitourinary: Negative for dysuria, frequency and urgency.  Musculoskeletal: Negative for myalgias.  Skin: Negative for rash.  Neurological: Negative for  dizziness, tingling and headaches.  Psychiatric/Behavioral: Positive for depression. The patient is nervous/anxious.     The problem list and medications were reviewed and updated by myself where necessary and exist elsewhere in the encounter.   OBJECTIVE:  BP (!) 150/100   Pulse 71   Temp 98.6 F (37 C) (Oral)   Resp 16   Ht 6\' 1"  (1.854 m)   Wt 240 lb (108.9 kg)   SpO2 96%   BMI 31.66 kg/m   Physical Exam  Constitutional: He is oriented to person, place, and time. He appears well-developed. He does not appear ill.  Eyes: Conjunctivae and EOM are normal. Pupils are equal, round, and reactive to light.  Cardiovascular: Normal rate and regular rhythm.   Pulmonary/Chest: Effort normal and breath sounds normal.  Abdominal: He exhibits no distension.  Genitourinary:        Musculoskeletal: Normal range of motion.  Neurological: He is alert and oriented to person, place, and time. No cranial nerve deficit. Coordination normal.  Skin: Skin is warm and dry. He is not diaphoretic.  Psychiatric: He has a normal mood and affect.  Nursing note and vitals reviewed.   No results found for this or any previous visit (from the past 72 hour(s)).  No results found.  ASSESSMENT AND PLAN  Mehul was seen today for bumps in groin area and medication refill.  Diagnoses and all orders for this  visit:  Penile rash -     WOUND CULTURE -     HIV antibody -     RPR -     GC/Chlamydia Probe Amp  Essential hypertension -     TSH -     CBC -     Lipid panel -     Hemoglobin A1c -     COMPLETE METABOLIC PANEL WITH GFR -     nebivolol (BYSTOLIC) 5 MG tablet; Take 1 tablet (5 mg total) by mouth daily.  Rectal bleeding: I can not appreciates any abnormalities on exam however he is having some rectal irritation. Will try anusol, and if this fails will get him to GI for a possible scope.  -     hydrocortisone (ANUSOL-HC) 25 MG suppository; Place 1 suppository (25 mg total) rectally 2  (two) times daily.  Anxiety and depression -     buPROPion (WELLBUTRIN) 75 MG tablet; Take 1 tablet (75 mg total) by mouth every morning.  Medication management -     citalopram (CELEXA) 10 MG tablet; Take 0.5 tablets (5 mg total) by mouth daily. -     LORazepam (ATIVAN) 1 MG tablet; Take 0.5-1 tablets (0.5-1 mg total) by mouth every 8 (eight) hours as needed (For panic symptoms only.).  Elevated blood pressure: He will deliver a BP log to me so we can consider changing/titrating his bp medication.  I may screen him for metanephrines.    Other orders -     doxycycline (VIBRAMYCIN) 100 MG capsule; Take 1 capsule (100 mg total) by mouth 2 (two) times daily.    The patient is advised to call or return to clinic if he does not see an improvement in symptoms, or to seek the care of the closest emergency department if he worsens with the above plan.   Deliah BostonMichael Clark, MHS, PA-C [facility] 11/10/2015 1:15 PM

## 2015-11-09 NOTE — Patient Instructions (Addendum)
Please monitor your BP daily for the next 2 weeks.  Write the numbers down and drop this by the office so I can review the data.  We may need to change/add your to your BP regimen.     IF you received an x-ray today, you will receive an invoice from [facility]. Please contact [facility] at [phone] with questions or concerns regarding your invoice.   IF you received labwork today, you will receive an invoice from [facility]. Please contact [facility] at [phone] with questions or concerns regarding your invoice.   Our billing staff will not be able to assist you with questions regarding bills from these companies.  You will be contacted with the lab results as soon as they are available. The fastest way to get your results is to activate your My Chart account. Instructions are located on the last page of this paperwork. If you have not heard from us regarding the results in 2 weeks, please contact this office.

## 2015-11-10 ENCOUNTER — Other Ambulatory Visit: Payer: Self-pay | Admitting: Physician Assistant

## 2015-11-10 ENCOUNTER — Telehealth: Payer: Self-pay | Admitting: *Deleted

## 2015-11-10 DIAGNOSIS — E875 Hyperkalemia: Secondary | ICD-10-CM

## 2015-11-10 LAB — CBC
HEMATOCRIT: 46.3 % (ref 38.5–50.0)
Hemoglobin: 16.4 g/dL (ref 13.2–17.1)
MCH: 32.7 pg (ref 27.0–33.0)
MCHC: 35.4 g/dL (ref 32.0–36.0)
MCV: 92.2 fL (ref 80.0–100.0)
MPV: 10.1 fL (ref 7.5–12.5)
Platelets: 235 10*3/uL (ref 140–400)
RBC: 5.02 MIL/uL (ref 4.20–5.80)
RDW: 12.7 % (ref 11.0–15.0)
WBC: 6.5 10*3/uL (ref 3.8–10.8)

## 2015-11-10 LAB — COMPLETE METABOLIC PANEL WITH GFR
ALT: 30 U/L (ref 9–46)
AST: 34 U/L (ref 10–40)
Albumin: 4.7 g/dL (ref 3.6–5.1)
Alkaline Phosphatase: 71 U/L (ref 40–115)
BUN: 11 mg/dL (ref 7–25)
CHLORIDE: 100 mmol/L (ref 98–110)
CO2: 23 mmol/L (ref 20–31)
Calcium: 9.3 mg/dL (ref 8.6–10.3)
Creat: 1.02 mg/dL (ref 0.60–1.35)
GFR, Est African American: >89 mL/min (ref >=60)
GFR, Est Non African American: >89 mL/min (ref >=60)
GLUCOSE: 94 mg/dL (ref 65–99)
POTASSIUM: 6.1 mmol/L — AB (ref 3.5–5.3)
SODIUM: 138 mmol/L (ref 135–146)
Total Bilirubin: 0.7 mg/dL (ref 0.2–1.2)
Total Protein: 7.2 g/dL (ref 6.1–8.1)

## 2015-11-10 LAB — LIPID PANEL
Cholesterol: 292 mg/dL — ABNORMAL HIGH (ref 125–200)
HDL: 60 mg/dL (ref >=40)
LDL CALC: 202 mg/dL — AB (ref <130)
Total CHOL/HDL Ratio: 4.9 Ratio (ref <=5.0)
Triglycerides: 149 mg/dL (ref <150)
VLDL: 30 mg/dL (ref <30)

## 2015-11-10 LAB — TSH: TSH: 1.6 mIU/L (ref 0.40–4.50)

## 2015-11-10 LAB — HIV ANTIBODY (ROUTINE TESTING W REFLEX): HIV: NONREACTIVE

## 2015-11-10 NOTE — Progress Notes (Signed)
We have called him and advised that he needs to come back in today for a redraw, and if he is symptomatic with dizziness or palpitations then go directly to the ED. Deliah BostonMichael Clark, MS, PA-C 2:09 PM, 11/10/2015

## 2015-11-10 NOTE — Telephone Encounter (Signed)
Patient needs to come back in for lab only to recheck potassium level.  His potassium was high.  If he is having any symptoms such as dizziness or not feeling well, he needs to go over to the ER.

## 2015-11-10 NOTE — Telephone Encounter (Signed)
Left message on vm that patient needs to call back as as possible.

## 2015-11-11 LAB — GC/CHLAMYDIA PROBE AMP
CT PROBE, AMP APTIMA: NOT DETECTED
GC PROBE AMP APTIMA: NOT DETECTED

## 2015-11-11 LAB — HEMOGLOBIN A1C
HEMOGLOBIN A1C: 5.4 % (ref <5.7)
Mean Plasma Glucose: 108 mg/dL

## 2015-11-11 LAB — RPR

## 2015-11-12 LAB — WOUND CULTURE
Gram Stain: NONE SEEN
Gram Stain: NONE SEEN
Organism ID, Bacteria: NORMAL

## 2015-11-17 ENCOUNTER — Encounter: Payer: Self-pay | Admitting: *Deleted

## 2015-11-18 ENCOUNTER — Encounter: Payer: Self-pay | Admitting: *Deleted

## 2016-02-11 IMAGING — CR DG CHEST 2V
2 series · 2 of 2 positions shown · non-contrast
Comparison: None.

CLINICAL DATA: 1 month ago of a cough. Rpak--1 pills but did not
finish, but was feeling better. Last week, the cough slowly
returned, worse and worse. Morning has some significant coughing.
Shallow breaths relieves coughing flares.

EXAM:
CHEST  2 VIEW

[PA]
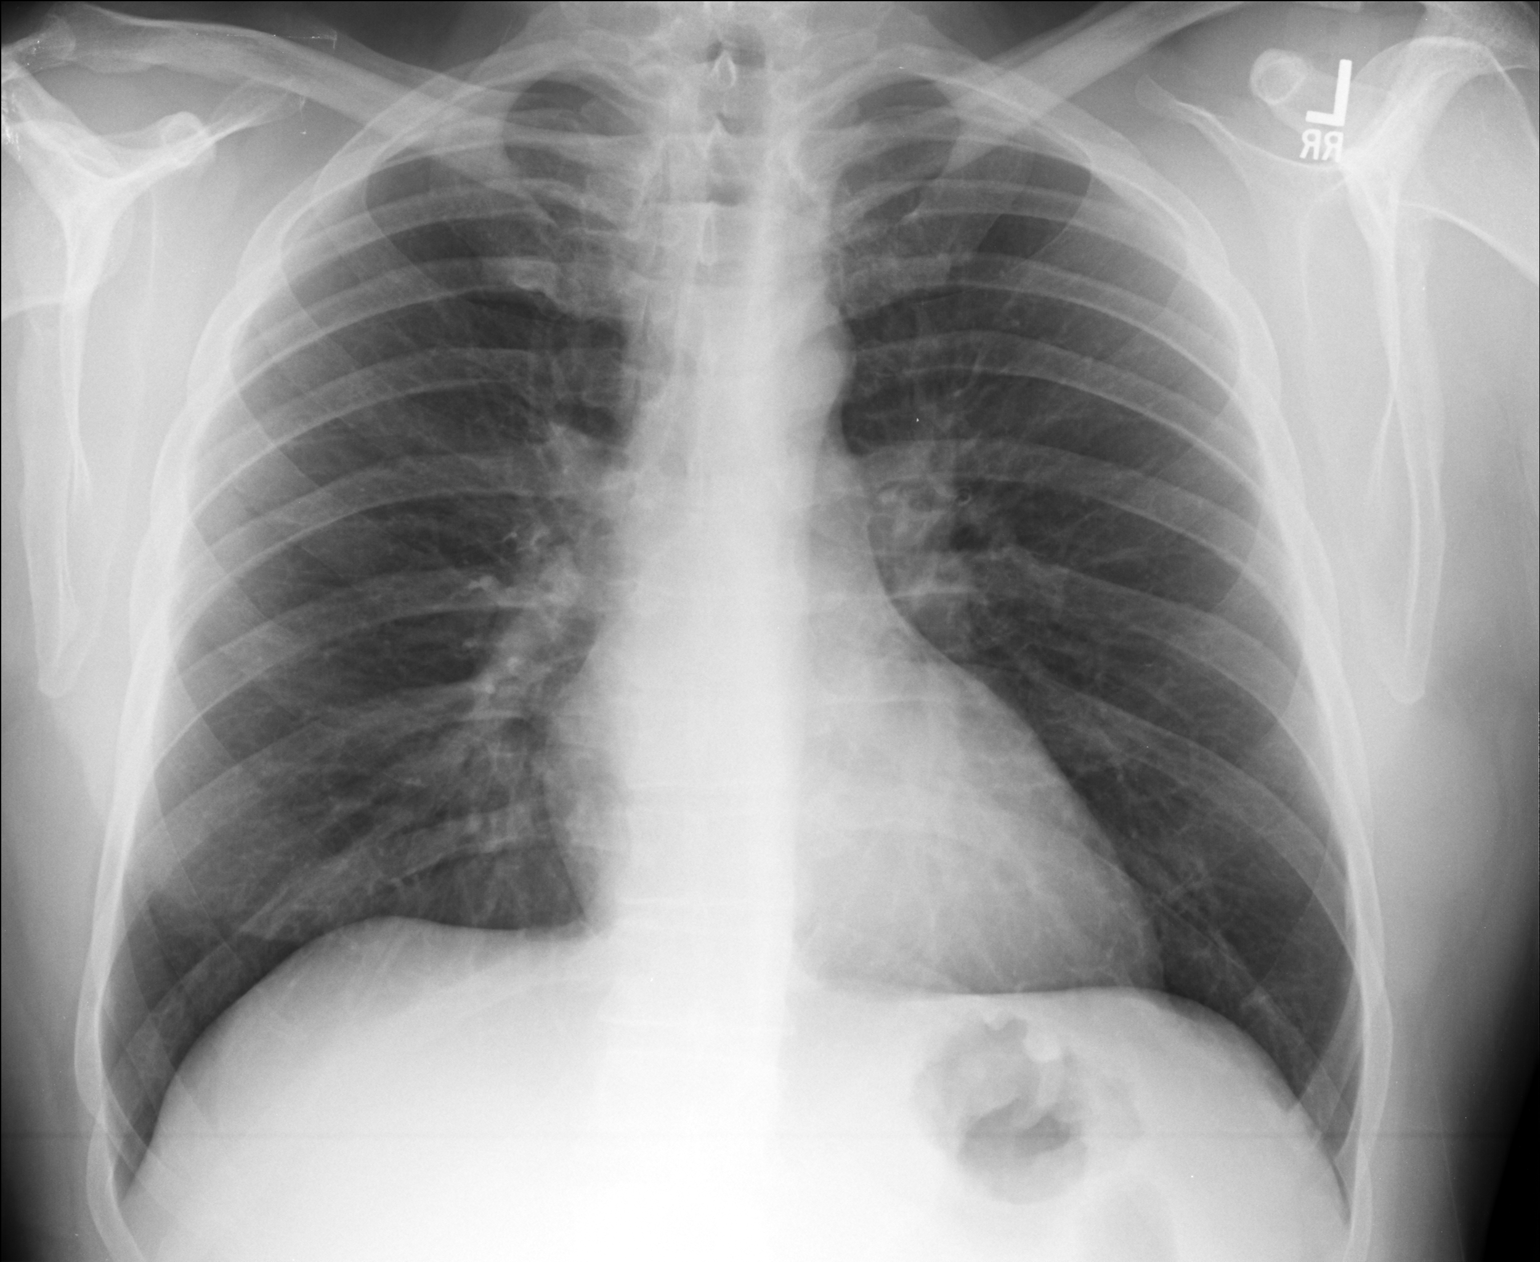

[lateral]
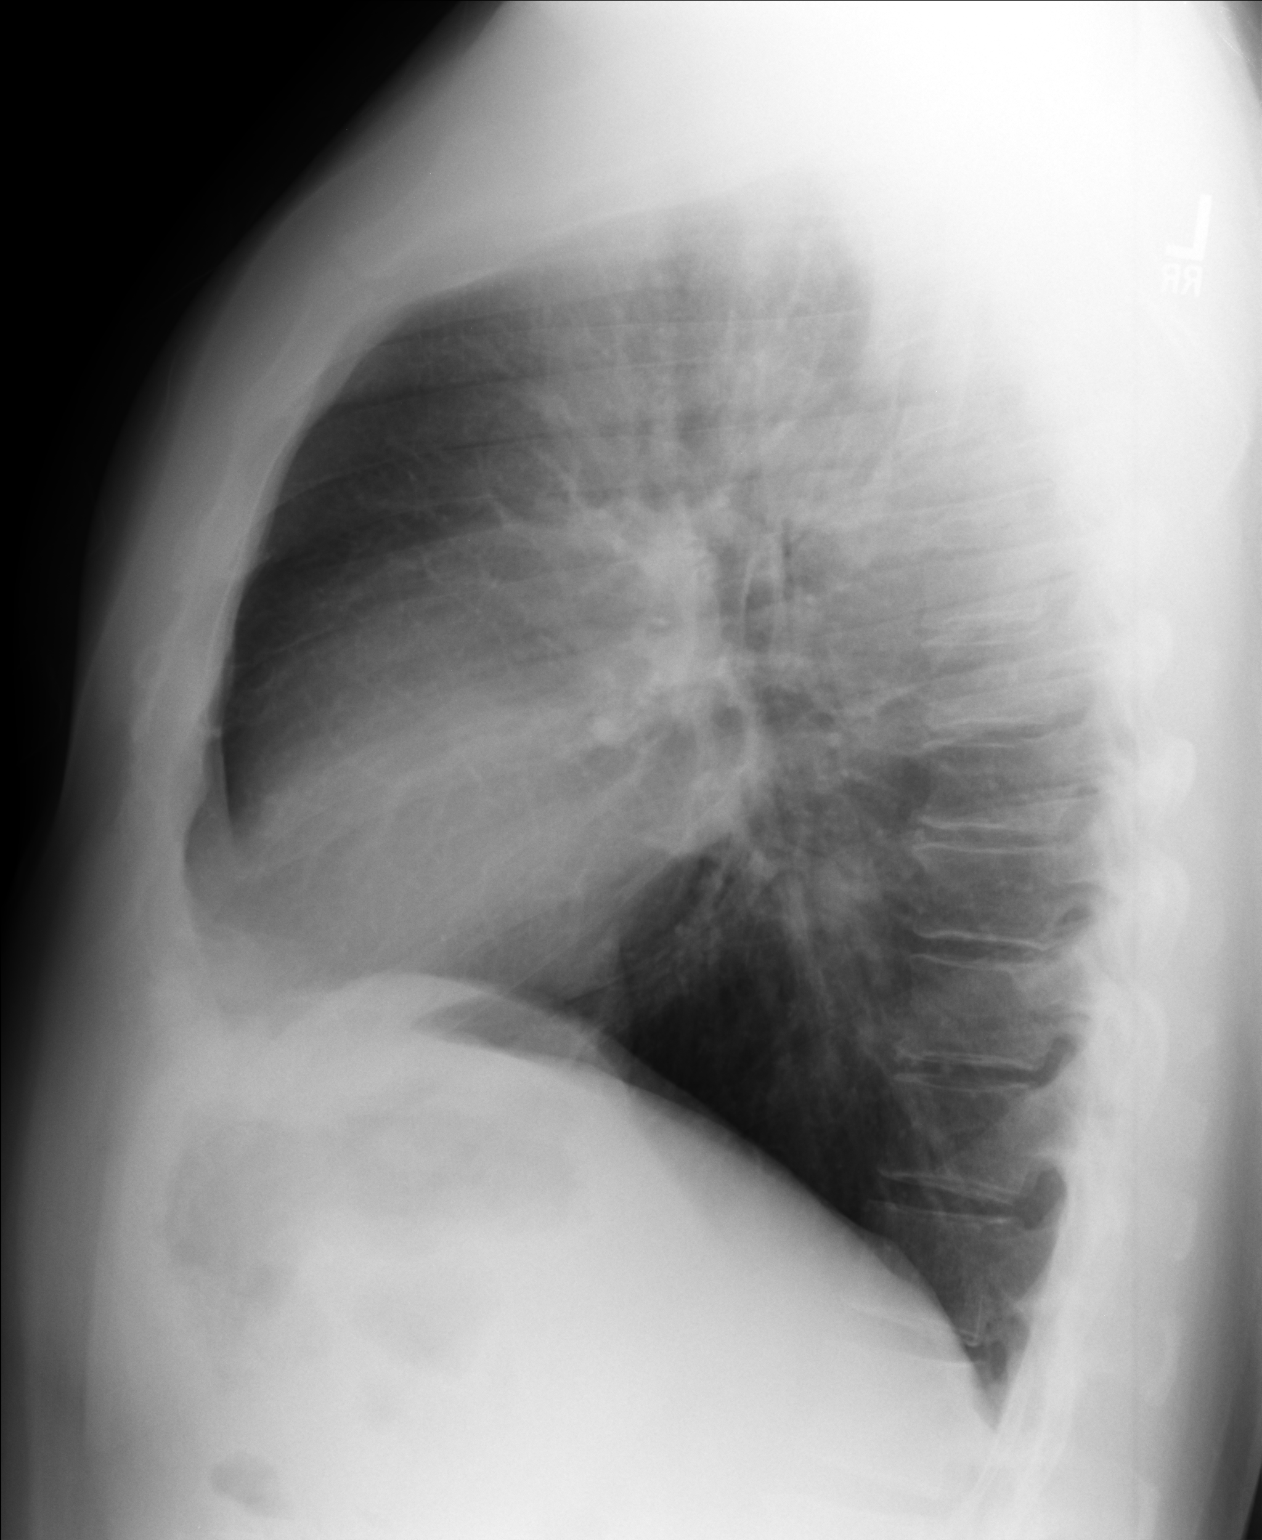

[2 of 2 positions shown; findings below may reference images not displayed]

FINDINGS: The heart size and mediastinal contours are within normal limits.
Both lungs are clear. No pleural effusion or pneumothorax. The
visualized skeletal structures are unremarkable.
IMPRESSION: No active cardiopulmonary disease.

## 2016-04-23 ENCOUNTER — Other Ambulatory Visit: Payer: Self-pay | Admitting: Physician Assistant

## 2016-04-23 DIAGNOSIS — Z79899 Other long term (current) drug therapy: Secondary | ICD-10-CM

## 2016-05-20 DIAGNOSIS — L0101 Non-bullous impetigo: Secondary | ICD-10-CM | POA: Diagnosis not present

## 2016-07-12 ENCOUNTER — Other Ambulatory Visit: Payer: Self-pay | Admitting: Physician Assistant

## 2016-07-12 DIAGNOSIS — Z79899 Other long term (current) drug therapy: Secondary | ICD-10-CM

## 2016-07-13 NOTE — Telephone Encounter (Signed)
04/24/16 last refill 10/2015 last ov

## 2016-07-15 NOTE — Telephone Encounter (Signed)
Called to pharmacy 

## 2016-08-21 ENCOUNTER — Other Ambulatory Visit: Payer: Self-pay | Admitting: Physician Assistant

## 2016-08-21 DIAGNOSIS — I10 Essential (primary) hypertension: Secondary | ICD-10-CM

## 2016-09-23 ENCOUNTER — Other Ambulatory Visit: Payer: Self-pay | Admitting: Physician Assistant

## 2016-09-23 DIAGNOSIS — I10 Essential (primary) hypertension: Secondary | ICD-10-CM

## 2016-09-25 ENCOUNTER — Other Ambulatory Visit: Payer: Self-pay | Admitting: Physician Assistant

## 2016-09-25 DIAGNOSIS — I10 Essential (primary) hypertension: Secondary | ICD-10-CM

## 2016-09-26 ENCOUNTER — Other Ambulatory Visit: Payer: Self-pay | Admitting: Physician Assistant

## 2016-09-26 DIAGNOSIS — I10 Essential (primary) hypertension: Secondary | ICD-10-CM

## 2016-09-27 ENCOUNTER — Encounter: Payer: Self-pay | Admitting: Family Medicine

## 2016-09-27 ENCOUNTER — Ambulatory Visit (INDEPENDENT_AMBULATORY_CARE_PROVIDER_SITE_OTHER): Payer: 59 | Admitting: Family Medicine

## 2016-09-27 VITALS — BP 124/87 | HR 100 | Temp 98.1°F | Resp 16 | Ht 74.0 in | Wt 241.0 lb

## 2016-09-27 DIAGNOSIS — L0100 Impetigo, unspecified: Secondary | ICD-10-CM | POA: Diagnosis not present

## 2016-09-27 DIAGNOSIS — I10 Essential (primary) hypertension: Secondary | ICD-10-CM

## 2016-09-27 MED ORDER — NEBIVOLOL HCL 5 MG PO TABS: 5.0000 mg | ORAL_TABLET | Freq: Every day | ORAL | 3 refills | Status: DC

## 2016-09-27 MED ORDER — MUPIROCIN CALCIUM 2 % EX CREA: 1.0000 "application " | TOPICAL_CREAM | Freq: Two times a day (BID) | CUTANEOUS | 0 refills | Status: DC

## 2016-09-27 NOTE — Patient Instructions (Signed)
     IF you received an x-ray today, you will receive an invoice from [facility]. Please contact [facility] at [phone] with questions or concerns regarding your invoice.   IF you received labwork today, you will receive an invoice from [facility]. Please contact [facility] at [phone] with questions or concerns regarding your invoice.   Our billing staff will not be able to assist you with questions regarding bills from these companies.  You will be contacted with the lab results as soon as they are available. The fastest way to get your results is to activate your My Chart account. Instructions are located on the last page of this paperwork. If you have not heard from us regarding the results in 2 weeks, please contact this office.     

## 2016-09-27 NOTE — Progress Notes (Signed)
8/17/20184:51 PM  Matthew Hanson 21-Mar-1975, 42 y.o. male [id number]  Chief Complaint  Patient presents with  . Medication Refill    Bystolic, has been out x 5 days  . Impetigo    Nose/face    HPI:   Patient is a 41 y.o. male with past medical history significant for HTN who presents today requesting refill of BP med, out for past 5 days, tolerating well.   Otherwise concerned he has impetigo again. He was trimming nose hairs with an electrical device and noticed moist yellowish discharge in his right nostril. He has had impetigo before. He denies any snorting related activity.  Depression screen [facility] 2/9 09/27/2016 11/09/2015 08/24/2015  Decreased Interest 0 0 0  Down, Depressed, Hopeless 0 0 0  PHQ - 2 Score 0 0 0    No Known Allergies  Current Outpatient Prescriptions on File Prior to Visit  Medication Sig Dispense Refill  . buPROPion (WELLBUTRIN) 75 MG tablet Take 1 tablet (75 mg total) by mouth every morning. 90 tablet 3  . citalopram (CELEXA) 10 MG tablet Take 0.5 tablets (5 mg total) by mouth daily. 45 tablet 3  . LORazepam (ATIVAN) 1 MG tablet TAKE 1/2 TO 1 TABLET BY MOUTH EVERY 8 HOURS AS NEEDED FOR PANIC SYMPTOMS ONLY 20 tablet 1   No current facility-administered medications on file prior to visit.     Past Medical History:  Diagnosis Date  . Anxiety   . Hypertension     No past surgical history on file.  Social History  Substance Use Topics  . Smoking status: Never Smoker  . Smokeless tobacco: Never Used  . Alcohol use 3.0 oz/week    5 Standard drinks or equivalent per week    Family History  Problem Relation Age of Onset  . Diabetes Father   . Heart disease Father   . Hyperlipidemia Father     Review of Systems  Constitutional: Negative for chills and fever.  Respiratory: Negative for cough and shortness of breath.   Cardiovascular: Negative for chest pain, palpitations and leg swelling.     OBJECTIVE:  Blood pressure 124/87, pulse 100,  temperature 98.1 F (36.7 C), temperature source Oral, resp. rate 16, height 6\' 2"  (1.88 m), weight 241 lb (109.3 kg), SpO2 95 %.  Physical Exam  Constitutional: He is oriented to person, place, and time and well-developed, well-nourished, and in no distress.  HENT:  Head: Normocephalic and atraumatic.  Mouth/Throat: Oropharynx is clear and moist.  Eyes: Pupils are equal, round, and reactive to light. EOM are normal.  Neck: Neck supple.  Cardiovascular: Normal rate and regular rhythm.  Exam reveals no gallop and no friction rub.   No murmur heard. Pulmonary/Chest: Effort normal and breath sounds normal. He has no wheezes. He has no rales.  Neurological: He is alert and oriented to person, place, and time. Gait normal.  Skin: Skin is warm and dry.  right tip of nasal septum with honey crusted rash. Nares swollen with scattered thick yellow/whitish scales   ASSESSMENT and PLAN:  1. Impetigo, unspecified Discussed care, medication r/se/b.  - mupirocin cream (BACTROBAN) 2 %; Apply 1 application topically 2 (two) times daily.  Dispense: 15 g; Refill: 0  2. Essential hypertension Reording medication today. Consider labs at next visit.  - nebivolol (BYSTOLIC) 5 MG tablet; Take 1 tablet (5 mg total) by mouth daily.  Dispense: 30 tablet; Refill: 3  RTC precautions discussed   FU 1 week    Bryleigh Ottaway M  Pamella Pert, MD [facility] [place], [place] [address] Ph.  [phone] Fax [phone]

## 2016-11-14 ENCOUNTER — Other Ambulatory Visit: Payer: Self-pay | Admitting: Physician Assistant

## 2016-11-14 DIAGNOSIS — Z79899 Other long term (current) drug therapy: Secondary | ICD-10-CM

## 2016-11-14 NOTE — Telephone Encounter (Signed)
I have not seen him in a year.  Please bring him in.

## 2016-11-14 NOTE — Telephone Encounter (Signed)
Refill req for Lorazepam Sent to [facility]

## 2016-11-22 NOTE — Telephone Encounter (Signed)
Call placed to patient to make him aware that he needs a visit. No answer on patient phone, message states "google voice subscriber is not available"/ S.Juliahna Wiswell,CMA

## 2016-12-11 ENCOUNTER — Ambulatory Visit (INDEPENDENT_AMBULATORY_CARE_PROVIDER_SITE_OTHER): Payer: 59 | Admitting: Emergency Medicine

## 2016-12-11 ENCOUNTER — Encounter: Payer: Self-pay | Admitting: Emergency Medicine

## 2016-12-11 VITALS — BP 112/80 | HR 90 | Temp 98.9°F | Resp 16 | Ht 73.0 in | Wt 229.4 lb

## 2016-12-11 DIAGNOSIS — F329 Major depressive disorder, single episode, unspecified: Secondary | ICD-10-CM

## 2016-12-11 DIAGNOSIS — F32A Depression, unspecified: Secondary | ICD-10-CM

## 2016-12-11 DIAGNOSIS — I10 Essential (primary) hypertension: Secondary | ICD-10-CM | POA: Diagnosis not present

## 2016-12-11 DIAGNOSIS — F419 Anxiety disorder, unspecified: Secondary | ICD-10-CM

## 2016-12-11 DIAGNOSIS — Z79899 Other long term (current) drug therapy: Secondary | ICD-10-CM

## 2016-12-11 DIAGNOSIS — F411 Generalized anxiety disorder: Secondary | ICD-10-CM

## 2016-12-11 MED ORDER — NEBIVOLOL HCL 5 MG PO TABS: 5.0000 mg | ORAL_TABLET | Freq: Every day | ORAL | 3 refills | Status: DC

## 2016-12-11 MED ORDER — LORAZEPAM 1 MG PO TABS: ORAL_TABLET | ORAL | 1 refills | Status: DC

## 2016-12-11 MED ORDER — CITALOPRAM HYDROBROMIDE 10 MG PO TABS: 5.0000 mg | ORAL_TABLET | Freq: Every day | ORAL | 3 refills | Status: DC

## 2016-12-11 MED ORDER — BUPROPION HCL 75 MG PO TABS: 75.0000 mg | ORAL_TABLET | ORAL | 3 refills | Status: DC

## 2016-12-11 NOTE — Patient Instructions (Addendum)
   IF you received an x-ray today, you will receive an invoice from [facility]. Please contact [facility] at [phone] with questions or concerns regarding your invoice.   IF you received labwork today, you will receive an invoice from [facility]. Please contact [facility] at [phone] with questions or concerns regarding your invoice.   Our billing staff will not be able to assist you with questions regarding bills from these companies.  You will be contacted with the lab results as soon as they are available. The fastest way to get your results is to activate your My Chart account. Instructions are located on the last page of this paperwork. If you have not heard from us regarding the results in 2 weeks, please contact this office.     Generalized Anxiety Disorder, Adult Generalized anxiety disorder (GAD) is a mental health disorder. People with this condition constantly worry about everyday events. Unlike normal anxiety, worry related to GAD is not triggered by a specific event. These worries also do not fade or get better with time. GAD interferes with life functions, including relationships, work, and school. GAD can vary from mild to severe. People with severe GAD can have intense waves of anxiety with physical symptoms (panic attacks). What are the causes? The exact cause of GAD is not known. What increases the risk? This condition is more likely to develop in:  Women.  People who have a family history of anxiety disorders.  People who are very shy.  People who experience very stressful life events, such as the death of a loved one.  People who have a very stressful family environment.  What are the signs or symptoms? People with GAD often worry excessively about many things in their lives, such as their health and family. They may also be overly concerned about:  Doing well at work.  Being on time.  Natural disasters.  Friendships.  Physical  symptoms of GAD include:  Fatigue.  Muscle tension or having muscle twitches.  Trembling or feeling shaky.  Being easily startled.  Feeling like your heart is pounding or racing.  Feeling out of breath or like you cannot take a deep breath.  Having trouble falling asleep or staying asleep.  Sweating.  Nausea, diarrhea, or irritable bowel syndrome (IBS).  Headaches.  Trouble concentrating or remembering facts.  Restlessness.  Irritability.  How is this diagnosed? Your health care provider can diagnose GAD based on your symptoms and medical history. You will also have a physical exam. The health care provider will ask specific questions about your symptoms, including how severe they are, when they started, and if they come and go. Your health care provider may ask you about your use of alcohol or drugs, including prescription medicines. Your health care provider may refer you to a mental health specialist for further evaluation. Your health care provider will do a thorough examination and may perform additional tests to rule out other possible causes of your symptoms. To be diagnosed with GAD, a person must have anxiety that:  Is out of his or her control.  Affects several different aspects of his or her life, such as work and relationships.  Causes distress that makes him or her unable to take part in normal activities.  Includes at least three physical symptoms of GAD, such as restlessness, fatigue, trouble concentrating, irritability, muscle tension, or sleep problems.  Before your health care provider can confirm a diagnosis of GAD, these symptoms must be present more days than   they are not, and they must last for six months or longer. How is this treated? The following therapies are usually used to treat GAD:  Medicine. Antidepressant medicine is usually prescribed for long-term daily control. Antianxiety medicines may be added in severe cases, especially when panic  attacks occur.  Talk therapy (psychotherapy). Certain types of talk therapy can be helpful in treating GAD by providing support, education, and guidance. Options include: ? Cognitive behavioral therapy (CBT). People learn coping skills and techniques to ease their anxiety. They learn to identify unrealistic or negative thoughts and behaviors and to replace them with positive ones. ? Acceptance and commitment therapy (ACT). This treatment teaches people how to be mindful as a way to cope with unwanted thoughts and feelings. ? Biofeedback. This process trains you to manage your body's response (physiological response) through breathing techniques and relaxation methods. You will work with a therapist while machines are used to monitor your physical symptoms.  Stress management techniques. These include yoga, meditation, and exercise.  A mental health specialist can help determine which treatment is best for you. Some people see improvement with one type of therapy. However, other people require a combination of therapies. Follow these instructions at home:  Take over-the-counter and prescription medicines only as told by your health care provider.  Try to maintain a normal routine.  Try to anticipate stressful situations and allow extra time to manage them.  Practice any stress management or self-calming techniques as taught by your health care provider.  Do not punish yourself for setbacks or for not making progress.  Try to recognize your accomplishments, even if they are small.  Keep all follow-up visits as told by your health care provider. This is important. Contact a health care provider if:  Your symptoms do not get better.  Your symptoms get worse.  You have signs of depression, such as: ? A persistently sad, cranky, or irritable mood. ? Loss of enjoyment in activities that used to bring you joy. ? Change in weight or eating. ? Changes in sleeping habits. ? Avoiding friends  or family members. ? Loss of energy for normal tasks. ? Feelings of guilt or worthlessness. Get help right away if:  You have serious thoughts about hurting yourself or others. If you ever feel like you may hurt yourself or others, or have thoughts about taking your own life, get help right away. You can go to your nearest emergency department or call:  Your local emergency services (911 in the [place]  A suicide crisis helpline, such as the National Suicide Prevention Lifeline at 1-800-273-8255. This is open 24 hours a day.  Summary  Generalized anxiety disorder (GAD) is a mental health disorder that involves worry that is not triggered by a specific event.  People with GAD often worry excessively about many things in their lives, such as their health and family.  GAD may cause physical symptoms such as restlessness, trouble concentrating, sleep problems, frequent sweating, nausea, diarrhea, headaches, and trembling or muscle twitching.  A mental health specialist can help determine which treatment is best for you. Some people see improvement with one type of therapy. However, other people require a combination of therapies. This information is not intended to replace advice given to you by your health care provider. Make sure you discuss any questions you have with your health care provider. Document Released: 05/25/2012 Document Revised: 12/19/2015 Document Reviewed: 12/19/2015 Elsevier Interactive Patient Education  2018 [employer]vier Inc[employer]  

## 2016-12-11 NOTE — Progress Notes (Signed)
Matthew Hanson 41 y.o.   Chief Complaint  Patient presents with  . Medication Refill    ALL medications with RF beside them    HISTORY OF PRESENT ILLNESS: This is a 41 y.o. male here for medication refills; seen by [name] on 11/30/2014 and plan for medication management created as outlined below. Still taking medications as prescribed then. Has h/o chronic anxiety.  Medication management: Mechanisms of action of Wellbutrin and SSRI's discussed with patient.  Advised that benzodiazepines will not prevent anxiety attacks but only treat them.  Advised that Wellbutrin could be contributing to his anxious affect.  Advised adding low dose Celexa to strike a balance between serotonin, dopamine, and norepinephrine.  Low dose benzo to bridge SSRI therapy.  He is to call in 2-3 weeks and leave a message with regard to how he is doing.  I will call him back at that time.   -     Discontinue: buPROPion (WELLBUTRIN SR) 150 MG 12 hr tablet; Take 1 tablet (150 mg total) by mouth 2 (two) times daily. -     citalopram (CELEXA) 10 MG tablet; Take 0.5 tablets (5 mg total) by mouth daily. -     LORazepam (ATIVAN) 1 MG tablet; Take 0.5-1 tablets (0.5-1 mg total) by mouth every 8 (eight) hours as needed for anxiety. -     buPROPion (WELLBUTRIN XL) 150 MG 24 hr tablet; Take 1 tablet (150 mg total) by mouth daily.  HPI   Prior to Admission medications   Medication Sig Start Date End Date Taking? Authorizing Provider  buPROPion (WELLBUTRIN) 75 MG tablet Take 1 tablet (75 mg total) by mouth every morning. 11/09/15  Yes Ofilia Neas, PA-C  citalopram (CELEXA) 10 MG tablet Take 0.5 tablets (5 mg total) by mouth daily. 11/09/15  Yes Ofilia Neas, PA-C  LORazepam (ATIVAN) 1 MG tablet TAKE 1/2 TO 1 TABLET BY MOUTH EVERY 8 HOURS AS NEEDED FOR PANIC SYMPTOMS ONLY 07/15/16  Yes Ofilia Neas, PA-C  mupirocin cream (BACTROBAN) 2 % Apply 1 application topically 2 (two) times daily. 09/27/16   Myles Lipps,  MD  nebivolol (BYSTOLIC) 5 MG tablet Take 1 tablet (5 mg total) by mouth daily. 09/27/16 10/27/16  Myles Lipps, MD    No Known Allergies  Patient Active Problem List   Diagnosis Date Noted  . Panic disorder without agoraphobia 09/12/2015  . Essential hypertension 09/01/2014  . Anxiety state 09/01/2014  . Depression 09/01/2014    Past Medical History:  Diagnosis Date  . Anxiety   . Hypertension     No past surgical history on file.  Social History   Social History  . Marital status: Married    Spouse name: N/A  . Number of children: N/A  . Years of education: N/A   Occupational History  . Not on file.   Social History Main Topics  . Smoking status: Never Smoker  . Smokeless tobacco: Never Used  . Alcohol use 3.0 oz/week    5 Standard drinks or equivalent per week  . Drug use: No  . Sexual activity: Not on file   Other Topics Concern  . Not on file   Social History Narrative  . No narrative on file    Family History  Problem Relation Age of Onset  . Diabetes Father   . Heart disease Father   . Hyperlipidemia Father      Review of Systems  Constitutional: Negative.  Negative for chills, fever and weight  loss.  HENT: Negative.   Eyes: Negative.   Respiratory: Negative.  Negative for cough and shortness of breath.   Cardiovascular: Negative.  Negative for chest pain and palpitations.  Gastrointestinal: Negative.  Negative for abdominal pain, diarrhea, nausea and vomiting.  Genitourinary: Negative.   Musculoskeletal: Negative.   Skin: Negative.  Negative for rash.  Neurological: Negative.  Negative for dizziness, seizures and loss of consciousness.  Endo/Heme/Allergies: Negative.   All other systems reviewed and are negative.  Vitals:   12/11/16 1324  BP: 112/80  Pulse: 90  Resp: 16  Temp: 98.9 F (37.2 C)  SpO2: 98%     Physical Exam  Constitutional: He is oriented to person, place, and time. He appears well-developed and well-nourished.   HENT:  Head: Normocephalic and atraumatic.  Nose: Nose normal.  Mouth/Throat: Oropharynx is clear and moist.  Eyes: Pupils are equal, round, and reactive to light. Conjunctivae and EOM are normal.  Neck: Normal range of motion. Neck supple. No JVD present. No thyromegaly present.  Cardiovascular: Normal rate, regular rhythm and normal heart sounds.   Pulmonary/Chest: Effort normal and breath sounds normal.  Abdominal: Soft. There is no tenderness.  Musculoskeletal: Normal range of motion.  Lymphadenopathy:    He has no cervical adenopathy.  Neurological: He is alert and oriented to person, place, and time. No sensory deficit. He exhibits normal muscle tone.  Skin: Skin is warm and dry. Capillary refill takes less than 2 seconds. No rash noted.  Psychiatric: He has a normal mood and affect. His behavior is normal.  Vitals reviewed.  A total of 25 minutes was spent in the room with the patient, greater than 50% of which was in counseling/coordination of care.  ASSESSMENT & PLAN: Matthew Hanson was seen today for medication refill.  Diagnoses and all orders for this visit:  GAD (generalized anxiety disorder)  Essential hypertension -     nebivolol (BYSTOLIC) 5 MG tablet; Take 1 tablet (5 mg total) by mouth daily.  Medication management -     Discontinue: LORazepam (ATIVAN) 1 MG tablet; TAKE 1/2 TO 1 TABLET BY MOUTH EVERY 8 HOURS AS NEEDED FOR PANIC SYMPTOMS ONLY -     Discontinue: citalopram (CELEXA) 10 MG tablet; Take 0.5 tablets (5 mg total) by mouth daily. -     citalopram (CELEXA) 10 MG tablet; Take 0.5 tablets (5 mg total) by mouth daily. -     LORazepam (ATIVAN) 1 MG tablet; TAKE 1/2 TO 1 TABLET BY MOUTH EVERY 8 HOURS AS NEEDED FOR PANIC SYMPTOMS ONLY  Anxiety and depression -     buPROPion (WELLBUTRIN) 75 MG tablet; Take 1 tablet (75 mg total) by mouth every morning.    Patient Instructions       IF you received an x-ray today, you will receive an invoice from [facility]. Please contact [facility] at [phone] with questions or concerns regarding your invoice.   IF you received labwork today, you will receive an invoice from [facility]. Please contact [facility] at [phone] with questions or concerns regarding your invoice.   Our billing staff will not be able to assist you with questions regarding bills from these companies.  You will be contacted with the lab results as soon as they are available. The fastest way to get your results is to activate your My Chart account. Instructions are located on the last page of this paperwork. If you have not heard from us regarding the results in 2 weeks, please contact this office.  Generalized Anxiety Disorder, Adult Generalized anxiety disorder (GAD) is a mental health disorder. People with this condition constantly worry about everyday events. Unlike normal anxiety, worry related to GAD is not triggered by a specific event. These worries also do not fade or get better with time. GAD interferes with life functions, including relationships, work, and school. GAD can vary from mild to severe. People with severe GAD can have intense waves of anxiety with physical symptoms (panic attacks). What are the causes? The exact cause of GAD is not known. What increases the risk? This condition is more likely to develop in:  Women.  People who have a family history of anxiety disorders.  People who are very shy.  People who experience very stressful life events, such as the death of a loved one.  People who have a very stressful family environment.  What are the signs or symptoms? People with GAD often worry excessively about many things in their lives, such as their health and family. They may also be overly concerned about:  Doing well at work.  Being on time.  Natural disasters.  Friendships.  Physical symptoms of GAD include:  Fatigue.  Muscle tension or having muscle  twitches.  Trembling or feeling shaky.  Being easily startled.  Feeling like your heart is pounding or racing.  Feeling out of breath or like you cannot take a deep breath.  Having trouble falling asleep or staying asleep.  Sweating.  Nausea, diarrhea, or irritable bowel syndrome (IBS).  Headaches.  Trouble concentrating or remembering facts.  Restlessness.  Irritability.  How is this diagnosed? Your health care provider can diagnose GAD based on your symptoms and medical history. You will also have a physical exam. The health care provider will ask specific questions about your symptoms, including how severe they are, when they started, and if they come and go. Your health care provider may ask you about your use of alcohol or drugs, including prescription medicines. Your health care provider may refer you to a mental health specialist for further evaluation. Your health care provider will do a thorough examination and may perform additional tests to rule out other possible causes of your symptoms. To be diagnosed with GAD, a person must have anxiety that:  Is out of his or her control.  Affects several different aspects of his or her life, such as work and relationships.  Causes distress that makes him or her unable to take part in normal activities.  Includes at least three physical symptoms of GAD, such as restlessness, fatigue, trouble concentrating, irritability, muscle tension, or sleep problems.  Before your health care provider can confirm a diagnosis of GAD, these symptoms must be present more days than they are not, and they must last for six months or longer. How is this treated? The following therapies are usually used to treat GAD:  Medicine. Antidepressant medicine is usually prescribed for long-term daily control. Antianxiety medicines may be added in severe cases, especially when panic attacks occur.  Talk therapy (psychotherapy). Certain types of talk  therapy can be helpful in treating GAD by providing support, education, and guidance. Options include: ? Cognitive behavioral therapy (CBT). People learn coping skills and techniques to ease their anxiety. They learn to identify unrealistic or negative thoughts and behaviors and to replace them with positive ones. ? Acceptance and commitment therapy (ACT). This treatment teaches people how to be mindful as a way to cope with unwanted thoughts and feelings. ? Biofeedback. This process trains you  to manage your body's response (physiological response) through breathing techniques and relaxation methods. You will work with a therapist while machines are used to monitor your physical symptoms.  Stress management techniques. These include yoga, meditation, and exercise.  A mental health specialist can help determine which treatment is best for you. Some people see improvement with one type of therapy. However, other people require a combination of therapies. Follow these instructions at home:  Take over-the-counter and prescription medicines only as told by your health care provider.  Try to maintain a normal routine.  Try to anticipate stressful situations and allow extra time to manage them.  Practice any stress management or self-calming techniques as taught by your health care provider.  Do not punish yourself for setbacks or for not making progress.  Try to recognize your accomplishments, even if they are small.  Keep all follow-up visits as told by your health care provider. This is important. Contact a health care provider if:  Your symptoms do not get better.  Your symptoms get worse.  You have signs of depression, such as: ? A persistently sad, cranky, or irritable mood. ? Loss of enjoyment in activities that used to bring you joy. ? Change in weight or eating. ? Changes in sleeping habits. ? Avoiding friends or family members. ? Loss of energy for normal tasks. ? Feelings of  guilt or worthlessness. Get help right away if:  You have serious thoughts about hurting yourself or others. If you ever feel like you may hurt yourself or others, or have thoughts about taking your own life, get help right away. You can go to your nearest emergency department or call:  Your local emergency services (911 in the [place]  A suicide crisis helpline, such as the National Suicide Prevention Lifeline at 954 194 4026. This is open 24 hours a day.  Summary  Generalized anxiety disorder (GAD) is a mental health disorder that involves worry that is not triggered by a specific event.  People with GAD often worry excessively about many things in their lives, such as their health and family.  GAD may cause physical symptoms such as restlessness, trouble concentrating, sleep problems, frequent sweating, nausea, diarrhea, headaches, and trembling or muscle twitching.  A mental health specialist can help determine which treatment is best for you. Some people see improvement with one type of therapy. However, other people require a combination of therapies. This information is not intended to replace advice given to you by your health care provider. Make sure you discuss any questions you have with your health care provider. Document Released: 05/25/2012 Document Revised: 12/19/2015 Document Reviewed: 12/19/2015 Elsevier Interactive Patient Education  2018 [employer].      Edwina Barth, MD [facility]

## 2016-12-11 NOTE — Progress Notes (Signed)
me

## 2017-02-17 ENCOUNTER — Telehealth: Payer: Self-pay | Admitting: Physician Assistant

## 2017-02-17 NOTE — Telephone Encounter (Signed)
trtied to call patient states voice mail not set up.  Patient will need an appointment

## 2017-02-17 NOTE — Telephone Encounter (Signed)
Rx for episodic treatment- left message on voice mail may not be able to refill, but will send request

## 2017-02-17 NOTE — Telephone Encounter (Signed)
Copied from CRM [id number]. Topic: Quick Communication - See Telephone Encounter >> Feb 17, 2017 12:07 PM Terisa Starraylor, Brittany L wrote: CRM for notification. See Telephone encounter for:   02/17/17.  mupirocin cream Idelle Jo(BACTROBAN) 2 %   [facility] [employer] [address] - [place], [place] - [address]

## 2017-02-18 ENCOUNTER — Encounter: Payer: Self-pay | Admitting: Emergency Medicine

## 2017-02-18 ENCOUNTER — Ambulatory Visit: Payer: 59 | Admitting: Emergency Medicine

## 2017-02-18 ENCOUNTER — Other Ambulatory Visit: Payer: Self-pay

## 2017-02-18 VITALS — BP 120/79 | HR 63 | Temp 98.3°F | Resp 16 | Ht 73.0 in | Wt 227.4 lb

## 2017-02-18 DIAGNOSIS — L0100 Impetigo, unspecified: Secondary | ICD-10-CM

## 2017-02-18 MED ORDER — MUPIROCIN 2 % EX OINT: 1.0000 "application " | TOPICAL_OINTMENT | Freq: Two times a day (BID) | CUTANEOUS | 0 refills | Status: DC

## 2017-02-18 NOTE — Patient Instructions (Addendum)
   IF you received an x-ray today, you will receive an invoice from [facility]. Please contact [facility] at [phone] with questions or concerns regarding your invoice.   IF you received labwork today, you will receive an invoice from [facility]. Please contact [facility] at [phone] with questions or concerns regarding your invoice.   Our billing staff will not be able to assist you with questions regarding bills from these companies.  You will be contacted with the lab results as soon as they are available. The fastest way to get your results is to activate your My Chart account. Instructions are located on the last page of this paperwork. If you have not heard from us regarding the results in 2 weeks, please contact this office.     Impetigo, Adult Impetigo is an infection of the skin. It commonly occurs in young children, but it can also occur in adults. The infection causes itchy blisters and sores that produce brownish-yellow fluid. As the fluid dries, it forms a thick, honey-colored crust. These skin changes usually occur on the face but can also affect other areas of the body. Impetigo usually goes away in 7-10 days with treatment. What are the causes? Impetigo is caused by two types of bacteria. It may be caused by staphylococci or streptococci bacteria. These bacteria cause impetigo when they get under the surface of the skin. This often happens after some damage to the skin, such as damage from:  Cuts, scrapes, or scratches.  Insect bites, especially when you scratch the area of a bite.  Chickenpox or other illnesses that cause open skin sores.  Nail biting or chewing.  Impetigo is contagious and can spread easily from one person to another. This may occur through close skin contact or by sharing towels, clothing, or other items with a person who has the infection. What increases the risk? Some things that can increase the risk of getting this  infection include:  Playing sports that include skin-to-skin contact with others.  Having a skin condition with open sores.  Having many skin cuts or scrapes.  Living in an area that has high humidity levels.  Having poor hygiene.  Having high levels of staphylococci in your nose.  What are the signs or symptoms? Impetigo usually starts out as small blisters, often on the face. The blisters then break open and turn into tiny sores (lesions) with a yellow crust. In some cases, the blisters cause itching or burning. With scratching, irritation, or lack of treatment, these small lesions may get larger. Scratching can also cause impetigo to spread to other parts of the body. The bacteria can get under the fingernails and spread when you touch another area of your skin. Other possible symptoms include:  Larger blisters.  Pus.  Swollen lymph glands.  How is this diagnosed? This condition is usually diagnosed during a physical exam. A skin sample or sample of fluid from a blister may be taken for lab tests that involve growing bacteria (culture test). This can help confirm the diagnosis or help determine the best treatment. How is this treated? Mild impetigo can be treated with prescription antibiotic cream. Oral antibiotic medicine may be used in more severe cases. Medicines for itching may also be used. Follow these instructions at home:  Take medicines only as directed by your health care provider.  To help prevent impetigo from spreading to other body areas: ? Keep your fingernails short and clean. ? Do not scratch the blisters or sores. ?   Cover infected areas, if necessary, to keep from scratching.  Gently wash the infected areas with antibiotic soap and water.  Soak crusted areas in warm, soapy water using antibiotic soap. ? Gently rub the areas to remove crusts. Do not scrub.  Wash your hands often to avoid spreading this infection.  Stay home until you have used an  antibiotic cream for 48 hours (2 days) or an oral antibiotic medicine for 24 hours (1 day). You should only return to work and activities with other people if your skin shows significant improvement. How is this prevented? To keep the infection from spreading:  Stay home until you have used an antibiotic cream for 48 hours or an oral antibiotic for 24 hours.  Wash your hands often.  Do not engage in skin-to-skin contact with other people while you have still have blisters.  Do not share towels, washcloths, or bedding with others while you have the infection.  Contact a health care provider if:  You develop more blisters or sores despite treatment.  Other family members get sores.  Your skin sores are not improving after 48 hours of treatment.  You have a fever. Get help right away if:  You see spreading redness or swelling of the skin around your sores.  You see red streaks coming from your sores.  You develop a sore throat. This information is not intended to replace advice given to you by your health care provider. Make sure you discuss any questions you have with your health care provider. Document Released: 02/18/2014 Document Revised: 07/06/2015 Document Reviewed: 01/11/2014 Elsevier Interactive Patient Education  2017 Elsevier Inc.  

## 2017-02-18 NOTE — Progress Notes (Signed)
Matthew Hanson 42 y.o.   Chief Complaint  Patient presents with  . Impetigo    x 2 days - per patient nose swelling again  . Medication Refill    BACTROBAN    HISTORY OF PRESENT ILLNESS: This is a 42 y.o. male complaining of nose infection, recurrent.  HPI   Prior to Admission medications   Medication Sig Start Date End Date Taking? Authorizing Provider  buPROPion (WELLBUTRIN) 75 MG tablet Take 1 tablet (75 mg total) by mouth every morning. 12/11/16  Yes Bence Trapp, Eilleen Kempf, MD  citalopram (CELEXA) 10 MG tablet Take 0.5 tablets (5 mg total) by mouth daily. 12/11/16  Yes Janelle Spellman, Eilleen Kempf, MD  LORazepam (ATIVAN) 1 MG tablet TAKE 1/2 TO 1 TABLET BY MOUTH EVERY 8 HOURS AS NEEDED FOR PANIC SYMPTOMS ONLY 12/11/16  Yes Ewan Grau, Eilleen Kempf, MD  mupirocin cream (BACTROBAN) 2 % Apply 1 application topically 2 (two) times daily. Patient not taking: Reported on 02/18/2017 09/27/16   Myles Lipps, MD  nebivolol (BYSTOLIC) 5 MG tablet Take 1 tablet (5 mg total) by mouth daily. 12/11/16 01/10/17  Georgina Quint, MD    No Known Allergies  Patient Active Problem List   Diagnosis Date Noted  . Panic disorder without agoraphobia 09/12/2015  . Essential hypertension 09/01/2014  . Anxiety state 09/01/2014  . Depression 09/01/2014    Past Medical History:  Diagnosis Date  . Anxiety   . Hypertension     No past surgical history on file.  Social History   Socioeconomic History  . Marital status: Married    Spouse name: Not on file  . Number of children: Not on file  . Years of education: Not on file  . Highest education level: Not on file  Social Needs  . Financial resource strain: Not on file  . Food insecurity - worry: Not on file  . Food insecurity - inability: Not on file  . Transportation needs - medical: Not on file  . Transportation needs - non-medical: Not on file  Occupational History  . Not on file  Tobacco Use  . Smoking status: Never Smoker  .  Smokeless tobacco: Never Used  Substance and Sexual Activity  . Alcohol use: Yes    Alcohol/week: 3.0 oz    Types: 5 Standard drinks or equivalent per week  . Drug use: No  . Sexual activity: Not on file  Other Topics Concern  . Not on file  Social History Narrative  . Not on file    Family History  Problem Relation Age of Onset  . Diabetes Father   . Heart disease Father   . Hyperlipidemia Father      Review of Systems  Constitutional: Negative.  Negative for chills and fever.  Respiratory: Negative for cough and shortness of breath.   Cardiovascular: Negative for chest pain and palpitations.  Gastrointestinal: Negative for abdominal pain, nausea and vomiting.  Neurological: Negative for dizziness and headaches.  Endo/Heme/Allergies: Negative.   All other systems reviewed and are negative.  Vitals:   02/18/17 1217  BP: 120/79  Pulse: 63  Resp: 16  Temp: 98.3 F (36.8 C)  SpO2: 95%     Physical Exam  Constitutional: He is oriented to person, place, and time. He appears well-developed and well-nourished.  HENT:  Head: Normocephalic and atraumatic.  Mouth/Throat: Oropharynx is clear and moist.  Nose: right nares appears infected.  Eyes: Pupils are equal, round, and reactive to light.  Neck: Normal range of motion.  Neck supple.  Cardiovascular: Normal rate and regular rhythm.  Pulmonary/Chest: Effort normal.  Musculoskeletal: Normal range of motion.  Neurological: He is alert and oriented to person, place, and time.  Skin: Skin is warm and dry. Capillary refill takes less than 2 seconds.  Psychiatric: He has a normal mood and affect. His behavior is normal.  Vitals reviewed.    ASSESSMENT & PLAN: Ferdinand was seen today for impetigo and medication refill.  Diagnoses and all orders for this visit:  Impetigo, unspecified Comments: nose Orders: -     mupirocin ointment (BACTROBAN) 2 %; Place 1 application into the nose 2 (two) times daily.   Patient  Instructions       IF you received an x-ray today, you will receive an invoice from [facility]. Please contact [facility] at [phone] with questions or concerns regarding your invoice.   IF you received labwork today, you will receive an invoice from [facility]. Please contact [facility] at [phone] with questions or concerns regarding your invoice.   Our billing staff will not be able to assist you with questions regarding bills from these companies.  You will be contacted with the lab results as soon as they are available. The fastest way to get your results is to activate your My Chart account. Instructions are located on the last page of this paperwork. If you have not heard from [place] regarding the results in 2 weeks, please contact this office.    Impetigo, Adult Impetigo is an infection of the skin. It commonly occurs in young children, but it can also occur in adults. The infection causes itchy blisters and sores that produce brownish-yellow fluid. As the fluid dries, it forms a thick, honey-colored crust. These skin changes usually occur on the face but can also affect other areas of the body. Impetigo usually goes away in 7-10 days with treatment. What are the causes? Impetigo is caused by two types of bacteria. It may be caused by staphylococci or streptococci bacteria. These bacteria cause impetigo when they get under the surface of the skin. This often happens after some damage to the skin, such as damage from:  Cuts, scrapes, or scratches.  Insect bites, especially when you scratch the area of a bite.  Chickenpox or other illnesses that cause open skin sores.  Nail biting or chewing.  Impetigo is contagious and can spread easily from one person to another. This may occur through close skin contact or by sharing towels, clothing, or other items with a person who has the infection. What increases the risk? Some things that can increase the risk of  getting this infection include:  Playing sports that include skin-to-skin contact with others.  Having a skin condition with open sores.  Having many skin cuts or scrapes.  Living in an area that has high humidity levels.  Having poor hygiene.  Having high levels of staphylococci in your nose.  What are the signs or symptoms? Impetigo usually starts out as small blisters, often on the face. The blisters then break open and turn into tiny sores (lesions) with a yellow crust. In some cases, the blisters cause itching or burning. With scratching, irritation, or lack of treatment, these small lesions may get larger. Scratching can also cause impetigo to spread to other parts of the body. The bacteria can get under the fingernails and spread when you touch another area of your skin. Other possible symptoms include:  Larger blisters.  Pus.  Swollen lymph glands.  How is  this diagnosed? This condition is usually diagnosed during a physical exam. A skin sample or sample of fluid from a blister may be taken for lab tests that involve growing bacteria (culture test). This can help confirm the diagnosis or help determine the best treatment. How is this treated? Mild impetigo can be treated with prescription antibiotic cream. Oral antibiotic medicine may be used in more severe cases. Medicines for itching may also be used. Follow these instructions at home:  Take medicines only as directed by your health care provider.  To help prevent impetigo from spreading to other body areas: ? Keep your fingernails short and clean. ? Do not scratch the blisters or sores. ? Cover infected areas, if necessary, to keep from scratching.  Gently wash the infected areas with antibiotic soap and water.  Soak crusted areas in warm, soapy water using antibiotic soap. ? Gently rub the areas to remove crusts. Do not scrub.  Wash your hands often to avoid spreading this infection.  Stay home until you have  used an antibiotic cream for 48 hours (2 days) or an oral antibiotic medicine for 24 hours (1 day). You should only return to work and activities with other people if your skin shows significant improvement. How is this prevented? To keep the infection from spreading:  Stay home until you have used an antibiotic cream for 48 hours or an oral antibiotic for 24 hours.  Wash your hands often.  Do not engage in skin-to-skin contact with other people while you have still have blisters.  Do not share towels, washcloths, or bedding with others while you have the infection.  Contact a health care provider if:  You develop more blisters or sores despite treatment.  Other family members get sores.  Your skin sores are not improving after 48 hours of treatment.  You have a fever. Get help right away if:  You see spreading redness or swelling of the skin around your sores.  You see red streaks coming from your sores.  You develop a sore throat. This information is not intended to replace advice given to you by your health care provider. Make sure you discuss any questions you have with your health care provider. Document Released: 02/18/2014 Document Revised: 07/06/2015 Document Reviewed: 01/11/2014 Elsevier Interactive Patient Education  2017 Elsevier Inc.      Edwina BarthMiguel Tharon Bomar, MD [facility]

## 2017-05-04 ENCOUNTER — Other Ambulatory Visit: Payer: Self-pay | Admitting: Emergency Medicine

## 2017-05-04 DIAGNOSIS — I10 Essential (primary) hypertension: Secondary | ICD-10-CM

## 2017-06-25 ENCOUNTER — Encounter: Payer: Self-pay | Admitting: Physician Assistant

## 2017-06-25 ENCOUNTER — Ambulatory Visit: Payer: 59 | Admitting: Physician Assistant

## 2017-06-25 ENCOUNTER — Other Ambulatory Visit: Payer: Self-pay

## 2017-06-25 DIAGNOSIS — Z79899 Other long term (current) drug therapy: Secondary | ICD-10-CM

## 2017-06-25 MED ORDER — BUSPIRONE HCL 5 MG PO TABS: 5.0000 mg | ORAL_TABLET | Freq: Two times a day (BID) | ORAL | 0 refills | Status: DC | PRN

## 2017-06-25 MED ORDER — LORAZEPAM 1 MG PO TABS: ORAL_TABLET | ORAL | 1 refills | Status: DC

## 2017-06-25 NOTE — Patient Instructions (Signed)
Start the buspar as needed.  I am continuing you lorazepam.  I look forward to seeing you back in about 6 months.  Come back sooner if you need me.

## 2017-06-25 NOTE — Progress Notes (Signed)
06/25/2017 4:47 PM   DOB: 13-Sep-1975 / MRN: [id number]  SUBJECTIVE:  Matthew Hanson is a 42 y.o. male presenting for recheck of anxiety and depression.  Patient has a long history of both and states neither is worse at this time.  Says he has a difficult time controlling his worry at this time because his wife and her husband have recently split up and now is not sure how his children feel well away they will be living in the next 2 to 3 months.  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  He tells me " I just cannot seem to turn her brain off."  He denies SI and HI.  He has No Known Allergies.   He  has a past medical history of Anxiety and Hypertension.    He  reports that he has never smoked. He has never used smokeless tobacco. He reports that he drinks about 3.0 oz of alcohol per week. He reports that he does not use drugs. He  has no sexual activity history on file. The patient  has no past surgical history on file.  His family history includes Diabetes in his father; Heart disease in his father; Hyperlipidemia in his father.  Review of Systems  Constitutional: Negative for fever.  Psychiatric/Behavioral: Positive for depression. Negative for hallucinations, memory loss, substance abuse and suicidal ideas. The patient is nervous/anxious and has insomnia.     The problem list and medications were reviewed and updated by myself where necessary and exist elsewhere in the encounter.   OBJECTIVE:  BP 108/74 (BP Location: Left Arm, Patient Position: Sitting)   Pulse (!) 50   Temp 98.6 F (37 C) (Oral)   Ht  (1.854 m)   Wt 212 lb 3.2 oz (96.3 kg)   SpO2 97%   BMI 28.00 kg/m   Physical Exam  Constitutional: He is oriented to person, place, and time. He appears well-developed. He does not appear ill.  Eyes: Pupils are equal, round, and reactive to light.  Conjunctivae and EOM are normal.  Cardiovascular: Normal rate.  Pulmonary/Chest: Effort normal.  Abdominal: He exhibits no distension.  Musculoskeletal: Normal range of motion.  Neurological: He is alert and oriented to person, place, and time. No cranial nerve deficit. Coordination normal.  Skin: Skin is warm and dry. He is not diaphoretic.  Psychiatric: He has a normal mood and affect. His behavior is normal. Judgment and thought content normal.  Nursing note and vitals reviewed.   No results found for this or any previous visit (from the past 72 hour(s)).  No results found.  ASSESSMENT AND PLAN:  Antonia was seen today for medication refill and blood work.  Diagnoses and all orders for this visit:  Medication management -     Discontinue: LORazepam (ATIVAN) 1 MG tablet; TAKE 1/2 TO 1 TABLET BY MOUTH EVERY 8 HOURS AS NEEDED FOR PANIC SYMPTOMS ONLY -     LORazepam (ATIVAN) 1 MG tablet; TAKE 1/2 TO 1 TABLET BY MOUTH EVERY 8 HOURS AS NEEDED FOR PANIC SYMPTOMS ONLY  Other orders -     busPIRone (BUSPAR) 5 MG tablet; Take 1 tablet (5 mg total) by mouth 2 (two) times daily as needed.    The patient is advised to call or return to clinic if he does not see an improvement in symptoms, or to seek  the care of the closest emergency department if he worsens with the above plan.   [name], MHS, PA-C [facility] 06/25/2017 4:47 PM

## 2017-07-22 ENCOUNTER — Other Ambulatory Visit: Payer: Self-pay | Admitting: Physician Assistant

## 2017-07-22 NOTE — Telephone Encounter (Signed)
Medication refilled per protocol. 

## 2017-08-11 ENCOUNTER — Other Ambulatory Visit: Payer: Self-pay | Admitting: Emergency Medicine

## 2017-08-11 DIAGNOSIS — I10 Essential (primary) hypertension: Secondary | ICD-10-CM

## 2017-09-24 ENCOUNTER — Telehealth: Payer: Self-pay | Admitting: Physician Assistant

## 2017-09-24 NOTE — Telephone Encounter (Signed)
Patient needs FMLA forms completed, he is coming in to see Casimiro NeedleMichael on 09/25/17 I will place the blank forms in Michael's box on 09/24/17 please return the completed forms to the FMLA/Disability box at the [address] checkout desk within 5-7 business days. Thank you!

## 2017-09-25 ENCOUNTER — Encounter: Payer: Self-pay | Admitting: Physician Assistant

## 2017-09-25 ENCOUNTER — Ambulatory Visit: Payer: 59 | Admitting: Physician Assistant

## 2017-09-25 VITALS — BP 154/98 | HR 69 | Temp 98.8°F | Resp 16 | Ht 73.0 in | Wt 222.0 lb

## 2017-09-25 DIAGNOSIS — F430 Acute stress reaction: Secondary | ICD-10-CM | POA: Diagnosis not present

## 2017-09-25 DIAGNOSIS — R61 Generalized hyperhidrosis: Secondary | ICD-10-CM

## 2017-09-25 DIAGNOSIS — F410 Panic disorder [episodic paroxysmal anxiety] without agoraphobia: Secondary | ICD-10-CM | POA: Diagnosis not present

## 2017-09-25 MED ORDER — LORAZEPAM 1 MG PO TABS: ORAL_TABLET | ORAL | 1 refills | Status: DC

## 2017-09-25 MED ORDER — ESCITALOPRAM OXALATE 10 MG PO TABS: 10.0000 mg | ORAL_TABLET | Freq: Every day | ORAL | 1 refills | Status: DC

## 2017-09-25 MED ORDER — QUETIAPINE FUMARATE 25 MG PO TABS: 25.0000 mg | ORAL_TABLET | Freq: Every day | ORAL | 3 refills | Status: DC

## 2017-09-25 NOTE — Patient Instructions (Signed)
  I will contact you with your lab results within the next 2 weeks.  If you have not heard from us then please contact us. The fastest way to get your results is to register for My Chart.   IF you received an x-ray today, you will receive an invoice from [facility]. Please contact [facility] at [phone] with questions or concerns regarding your invoice.   IF you received labwork today, you will receive an invoice from [facility]. Please contact [facility] at [phone] with questions or concerns regarding your invoice.   Our billing staff will not be able to assist you with questions regarding bills from these companies.  You will be contacted with the lab results as soon as they are available. The fastest way to get your results is to activate your My Chart account. Instructions are located on the last page of this paperwork. If you have not heard from us regarding the results in 2 weeks, please contact this office.     

## 2017-09-25 NOTE — Progress Notes (Signed)
09/25/2017 2:54 PM   DOB: 03/27/1975 / MRN: [id number]  SUBJECTIVE:  Matthew Hanson is a 42 y.o. male presenting for anxiety with anxiety with new panic exacerbation. Symptoms present since August 1st at which time he became unable to work due to exceptional occupation and family stressors.  Reports diaphoresis, feelings of dread, heart palpitations, inability to sleep, leg swelling.  The problem is worsening. He has tried his formal medications for anxiety and depression but his symptoms have pushed through.  He is not at risk of harming himself or others. He can not work in this state.   He has No Known Allergies.   He  has a past medical history of Anxiety and Hypertension.    He  reports that he has never smoked. He has never used smokeless tobacco. He reports that he drinks about 5.0 standard drinks of alcohol per week. He reports that he does not use drugs. He  has no sexual activity history on file. The patient  has no past surgical history on file.  His family history includes Diabetes in his father; Heart disease in his father; Hyperlipidemia in his father.  Review of Systems  Constitutional: Positive for diaphoresis and malaise/fatigue. Negative for chills, fever and weight loss.  Cardiovascular: Negative for chest pain.  Skin: Negative for itching and rash.  Neurological: Positive for tremors. Negative for dizziness, tingling, sensory change, speech change, focal weakness, weakness and headaches.  Psychiatric/Behavioral: Positive for depression. Negative for hallucinations, memory loss, substance abuse and suicidal ideas. The patient is nervous/anxious and has insomnia.     The problem list and medications were reviewed and updated by myself where necessary and exist elsewhere in the encounter.   OBJECTIVE:  BP (!) 154/98   Pulse 69   Temp 98.8 F (37.1 C) (Oral)   Resp 16   Ht 6\' 1"  (1.854 m)   Wt 222 lb (100.7 kg)   SpO2 99%   BMI 29.29 kg/m   Wt Readings from  Last 3 Encounters:  09/25/17 222 lb (100.7 kg)  06/25/17 212 lb 3.2 oz (96.3 kg)  02/18/17 227 lb 6.4 oz (103.1 kg)   Temp Readings from Last 3 Encounters:  09/25/17 98.8 F (37.1 C) (Oral)  06/25/17 98.6 F (37 C) (Oral)  02/18/17 98.3 F (36.8 C) (Oral)   BP Readings from Last 3 Encounters:  09/25/17 (!) 154/98  06/25/17 108/74  02/18/17 120/79   Pulse Readings from Last 3 Encounters:  09/25/17 69  06/25/17 (!) 50  02/18/17 63    Physical Exam  Constitutional: He is oriented to person, place, and time. He appears well-developed and well-nourished. He does not appear ill. No distress.  Eyes: Pupils are equal, round, and reactive to light. Conjunctivae and EOM are normal.  Cardiovascular: Normal rate.  Pulmonary/Chest: Effort normal.  Abdominal: He exhibits no distension.  Musculoskeletal: Normal range of motion.  Neurological: He is alert and oriented to person, place, and time. No cranial nerve deficit. Coordination normal.  Skin: Skin is warm. He is diaphoretic.  Psychiatric: His mood appears anxious. His affect is not angry, not blunt, not labile and not inappropriate. His speech is not rapid and/or pressured, not delayed, not tangential and not slurred. He is agitated. He is not aggressive, not hyperactive, not slowed, not withdrawn, not actively hallucinating and not combative. Thought content is not paranoid and not delusional. Cognition and memory are not impaired. He does not express impulsivity or inappropriate judgment. He exhibits a depressed mood.  He expresses no homicidal and no suicidal ideation. He expresses no suicidal plans and no homicidal plans. He is communicative. He exhibits normal recent memory.  Nursing note and vitals reviewed.   Lab Results  Component Value Date   HGBA1C 5.4 11/09/2015    Lab Results  Component Value Date   WBC 6.5 11/09/2015   HGB 16.4 11/09/2015   HCT 46.3 11/09/2015   MCV 92.2 11/09/2015   PLT 235 11/09/2015    Lab  Results  Component Value Date   CREATININE 1.02 11/09/2015   BUN 11 11/09/2015   NA 138 11/09/2015   K 6.1 (H) 11/09/2015   CL 100 11/09/2015   CO2 23 11/09/2015    Lab Results  Component Value Date   ALT 30 11/09/2015   AST 34 11/09/2015   ALKPHOS 71 11/09/2015   BILITOT 0.7 11/09/2015    Lab Results  Component Value Date   TSH 1.60 11/09/2015    Lab Results  Component Value Date   CHOL 292 (H) 11/09/2015   HDL 60 11/09/2015   LDLCALC 202 (H) 11/09/2015   TRIG 149 11/09/2015   CHOLHDL 4.9 11/09/2015     ASSESSMENT AND PLAN:  Matthew Hanson was seen today for fmla.  Diagnoses and all orders for this visit:  Panic state as acute reaction to exceptional (gross) stress: He can not work like this.  I am writing him out for short term disability and will see him back next Wednesday for re-eval. Changing his medications to include higher dose of SSRI, antipsychotic for sleep, and benzodiazepine for acute episodes of panic.  -     LORazepam (ATIVAN) 1 MG tablet; TAKE 1/2 TO 1 TABLET BY MOUTH EVERY 8 HOURS AS NEEDED FOR PANIC SYMPTOMS ONLY -     QUEtiapine (SEROQUEL) 25 MG tablet; Take 1-2 tablets (25-50 mg total) by mouth at bedtime. -     escitalopram (LEXAPRO) 10 MG tablet; Take 1 tablet (10 mg total) by mouth daily.  Diaphoresis -     EKG 12-Lead    The patient is advised to call or return to clinic if he does not see an improvement in symptoms, or to seek the care of the closest emergency department if he worsens with the above plan.   Deliah BostonMichael Fowler Antos, MHS, PA-C Primary Care at [facility] 09/25/2017 2:54 PM

## 2017-09-26 LAB — RENAL FUNCTION PANEL
Albumin: 4.4 g/dL (ref 3.5–5.5)
BUN/Creatinine Ratio: 5 — ABNORMAL LOW (ref 9–20)
BUN: 7 mg/dL (ref 6–24)
CO2: 25 mmol/L (ref 20–29)
Calcium: 9.5 mg/dL (ref 8.7–10.2)
Chloride: 97 mmol/L (ref 96–106)
Creatinine, Ser: 1.47 mg/dL — ABNORMAL HIGH (ref 0.76–1.27)
GFR calc Af Amer: 67 mL/min/{1.73_m2} (ref >59)
GFR calc non Af Amer: 58 mL/min/{1.73_m2} — ABNORMAL LOW (ref >59)
Glucose: 85 mg/dL (ref 65–99)
Phosphorus: 3.5 mg/dL (ref 2.5–4.5)
Potassium: 4.1 mmol/L (ref 3.5–5.2)
Sodium: 136 mmol/L (ref 134–144)

## 2017-09-26 LAB — TSH: TSH: 1.42 u[IU]/mL (ref 0.450–4.500)

## 2017-09-26 LAB — MAGNESIUM: Magnesium: 2.4 mg/dL — ABNORMAL HIGH (ref 1.6–2.3)

## 2017-09-26 NOTE — Telephone Encounter (Signed)
PAPERWORK SCANNED AND FAXED ON 09/26/17

## 2017-10-01 ENCOUNTER — Other Ambulatory Visit: Payer: Self-pay

## 2017-10-01 ENCOUNTER — Encounter: Payer: Self-pay | Admitting: Physician Assistant

## 2017-10-01 ENCOUNTER — Ambulatory Visit: Payer: 59 | Admitting: Physician Assistant

## 2017-10-01 DIAGNOSIS — F430 Acute stress reaction: Secondary | ICD-10-CM | POA: Diagnosis not present

## 2017-10-01 DIAGNOSIS — F410 Panic disorder [episodic paroxysmal anxiety] without agoraphobia: Secondary | ICD-10-CM | POA: Diagnosis not present

## 2017-10-01 MED ORDER — QUETIAPINE FUMARATE 25 MG PO TABS: 50.0000 mg | ORAL_TABLET | Freq: Every day | ORAL | 3 refills | Status: DC

## 2017-10-01 NOTE — Progress Notes (Signed)
10/01/2017 3:02 PM   DOB: 1975/11/03 / MRN: [id number]  SUBJECTIVE:  Matthew Hanson is a 42 y.o. male presenting for recheck panic state. He is roughly 30% better on the whole compared to his old baseline.  He is taking lorazepam maybe once a day but is trying to avoid needing this.  He is taking seroquel one tab QHS and gets maybe 4-5 hours of sleep.  He still has breakthrough symptoms that include profuse episodes of sweating and uncontrollable worry.  He continues to live in his parents home with his two children and is working to secure housing at this time.   He has No Known Allergies.   He  has a past medical history of Anxiety and Hypertension.    He  reports that he has never smoked. He has never used smokeless tobacco. He reports that he drinks about 5.0 standard drinks of alcohol per week. He reports that he does not use drugs. He  has no sexual activity history on file. The patient  has no past surgical history on file.  His family history includes Diabetes in his father; Heart disease in his father; Hyperlipidemia in his father.  Review of Systems  Constitutional: Negative for chills, diaphoresis and fever.  Gastrointestinal: Negative for nausea.  Skin: Negative for rash.  Neurological: Negative for dizziness.  Psychiatric/Behavioral: Positive for depression. Negative for hallucinations, memory loss, substance abuse and suicidal ideas. The patient is nervous/anxious and has insomnia.     The problem list and medications were reviewed and updated by myself where necessary and exist elsewhere in the encounter.   OBJECTIVE:  BP (!) 152/85   Pulse 64   Temp 98 F (36.7 C) (Oral)   Resp 16   Ht 6\' 1"  (1.854 m)   Wt 222 lb (100.7 kg)   SpO2 99%   BMI 29.29 kg/m   Wt Readings from Last 3 Encounters:  10/01/17 222 lb (100.7 kg)  09/25/17 222 lb (100.7 kg)  06/25/17 212 lb 3.2 oz (96.3 kg)   Temp Readings from Last 3 Encounters:  10/01/17 98 F (36.7 C) (Oral)    09/25/17 98.8 F (37.1 C) (Oral)  06/25/17 98.6 F (37 C) (Oral)   BP Readings from Last 3 Encounters:  10/01/17 (!) 152/85  09/25/17 (!) 154/98  06/25/17 108/74   Pulse Readings from Last 3 Encounters:  10/01/17 64  09/25/17 69  06/25/17 (!) 50    Physical Exam  Constitutional: He is oriented to person, place, and time. He appears well-developed. He does not appear ill.  Eyes: Pupils are equal, round, and reactive to light. Conjunctivae and EOM are normal.  Cardiovascular: Normal rate.  Pulmonary/Chest: Effort normal.  Abdominal: He exhibits no distension.  Musculoskeletal: Normal range of motion.  Neurological: He is alert and oriented to person, place, and time. No cranial nerve deficit. Coordination normal.  Skin: Skin is warm and dry. He is not diaphoretic.  Psychiatric: His mood appears anxious. His affect is not angry, not blunt, not labile and not inappropriate. His speech is not rapid and/or pressured, not delayed, not tangential and not slurred. He is hyperactive. He is not agitated, not aggressive, not slowed, not withdrawn, not actively hallucinating and not combative. Thought content is not paranoid and not delusional. Cognition and memory are normal. He does not express impulsivity or inappropriate judgment. He exhibits a depressed mood. He expresses no homicidal and no suicidal ideation. He expresses no suicidal plans and no homicidal plans. He is  communicative. He is attentive.  Nursing note and vitals reviewed.   Lab Results  Component Value Date   HGBA1C 5.4 11/09/2015    Lab Results  Component Value Date   WBC 6.5 11/09/2015   HGB 16.4 11/09/2015   HCT 46.3 11/09/2015   MCV 92.2 11/09/2015   PLT 235 11/09/2015    Lab Results  Component Value Date   CREATININE 1.47 (H) 09/25/2017   BUN 7 09/25/2017   NA 136 09/25/2017   K 4.1 09/25/2017   CL 97 09/25/2017   CO2 25 09/25/2017    Lab Results  Component Value Date   ALT 30 11/09/2015   AST 34  11/09/2015   ALKPHOS 71 11/09/2015   BILITOT 0.7 11/09/2015    Lab Results  Component Value Date   TSH 1.420 09/25/2017    Lab Results  Component Value Date   CHOL 292 (H) 11/09/2015   HDL 60 11/09/2015   LDLCALC 202 (H) 11/09/2015   TRIG 149 11/09/2015   CHOLHDL 4.9 11/09/2015     ASSESSMENT AND PLAN:  Matthew Hanson was seen today for stress.  Diagnoses and all orders for this visit:  Panic state as acute reaction to exceptional (gross) stress Comments: He has made some good strides but continues to suffer.  I am surprised his panic is pushing through the medication dosages.  Increasing seroquel to 50 mg qhs for its anxiolytic and sedative properties and have as him to schedule the ativan to twice daily.  He will come back for eval with PA [name] in two weeks to determine is he is well enough to go back to work. He may benefit from an increased dose of lexapro at that time depending on how he is feeling.  He may also benefit from a reduced work schedule, such as 4 to 6 hours days.   Orders: -     QUEtiapine (SEROQUEL) 25 MG tablet; Take 2 tablets (50 mg total) by mouth at bedtime.    The patient is advised to call or return to clinic if he does not see an improvement in symptoms, or to seek the care of the closest emergency department if he worsens with the above plan.   Deliah BostonMichael Letha Mirabal, MHS, PA-C Primary Care at [facility] 10/01/2017 3:02 PM

## 2017-10-01 NOTE — Patient Instructions (Signed)
° ° ° °  If you have lab work done today you will be contacted with your lab results within the next 2 weeks.  If you have not heard from us then please contact us. The fastest way to get your results is to register for My Chart. ° ° °IF you received an x-ray today, you will receive an invoice from [facility]. Please contact [facility] at [phone] with questions or concerns regarding your invoice.  ° °IF you received labwork today, you will receive an invoice from [facility]. Please contact [facility] at [phone] with questions or concerns regarding your invoice.  ° °Our billing staff will not be able to assist you with questions regarding bills from these companies. ° °You will be contacted with the lab results as soon as they are available. The fastest way to get your results is to activate your My Chart account. Instructions are located on the last page of this paperwork. If you have not heard from us regarding the results in 2 weeks, please contact this office. °  ° ° ° °

## 2017-10-03 NOTE — Telephone Encounter (Signed)
Patient needs disability forms completed by Deliah BostonMichael Clark. I have completed what I could from his OV notes and the last forms we completed but I was not sure about some of the questions. I will place the forms in Michael's box on 10/03/17. Please return to the FMLA/Disability box at the 102 checkout desk within 5-7 business day. Thank you!

## 2017-10-07 NOTE — Telephone Encounter (Signed)
Paperwork scanned and faxed on 10/07/17 °

## 2017-10-16 ENCOUNTER — Ambulatory Visit: Payer: 59 | Admitting: Physician Assistant

## 2017-10-21 ENCOUNTER — Encounter: Payer: Self-pay | Admitting: Physician Assistant

## 2017-10-21 ENCOUNTER — Other Ambulatory Visit: Payer: Self-pay

## 2017-10-21 ENCOUNTER — Ambulatory Visit: Payer: 59 | Admitting: Physician Assistant

## 2017-10-21 DIAGNOSIS — F410 Panic disorder [episodic paroxysmal anxiety] without agoraphobia: Secondary | ICD-10-CM

## 2017-10-21 DIAGNOSIS — F430 Acute stress reaction: Secondary | ICD-10-CM

## 2017-10-21 MED ORDER — ESCITALOPRAM OXALATE 20 MG PO TABS: 20.0000 mg | ORAL_TABLET | Freq: Every day | ORAL | 3 refills | Status: DC

## 2017-10-21 MED ORDER — LORAZEPAM 1 MG PO TABS
ORAL_TABLET | ORAL | 1 refills | Status: DC
Start: 2017-10-21 — End: 2018-01-02

## 2017-10-21 NOTE — Progress Notes (Signed)
Matthew Hanson  MRN: [id number] DOB: 12/22/1975  PCP: Sebastian Ache, PA-C  Subjective:  Pt is a 42 year old male PMH anxiety and depression who presents to clinic for f/u panic state. He is a former pt of Matthew Hanson. Last OV for this problem was 8/15 and 8/21 s/p panic exacerbation. "Symptoms present since August 1st at which time he became unable to work due to exceptional occupation and family stressors."  FMLA papers for short term disability filled out 09/25/2016 by Matthew Hanson  Last OV on 8/21 pt stated he was 30% better on a whole compared to his baseline.  Plan from last OV: "He may benefit from an increased dose of lexapro depending on how he is feeling.  He may also benefit from a reduced work schedule, such as 4 to 6 hours days"  Pt is taking:  Ativan 1mg  bid PRN - helps "to a degree".  buspar 5mg  bid PRN - is not helping at all.  seroquel 50mg  qhs - Helping. Sleeping about 5 hours/night, which is up from 3-4 hours/night lexapro 10mg  qd - not helping much. "I can't stop my mind from spinning"  He has not seen a therapist. Stress relief is going to the gym. [employer] with a friend last week.   He continues to live in his parents 1200 sq ft home with his two children - one of which is autistic- and is working to secure housing at this time. Has appt next week to check out a new home. Would be able to move in next month. He is afraid of losing his job when he returns to work.  Recent increased life stressors with ex-wife.    Is interested in going back to work soon, however thinks he is still too unstable.   Review of Systems  Gastrointestinal: Negative for abdominal pain, nausea and vomiting.  Psychiatric/Behavioral: Positive for dysphoric mood. Negative for self-injury and suicidal ideas. The patient is nervous/anxious.     Patient Active Problem List   Diagnosis Date Noted  . Panic disorder without agoraphobia 09/12/2015  . Essential hypertension  09/01/2014  . Anxiety state 09/01/2014  . Depression 09/01/2014    Current Outpatient Medications on File Prior to Visit  Medication Sig Dispense Refill  . busPIRone (BUSPAR) 5 MG tablet Take 5 mg by mouth 3 (three) times daily.    Marland Kitchen BYSTOLIC 5 MG tablet TAKE ONE TABLET BY MOUTH DAILY 30 tablet 5  . escitalopram (LEXAPRO) 10 MG tablet Take 1 tablet (10 mg total) by mouth daily. 90 tablet 1  . LORazepam (ATIVAN) 1 MG tablet TAKE 1/2 TO 1 TABLET BY MOUTH EVERY 8 HOURS AS NEEDED FOR PANIC SYMPTOMS ONLY 60 tablet 1  . QUEtiapine (SEROQUEL) 25 MG tablet Take 2 tablets (50 mg total) by mouth at bedtime. 60 tablet 3   No current facility-administered medications on file prior to visit.     No Known Allergies   Objective:  BP (!) 142/91 (BP Location: Right Arm, Patient Position: Sitting, Cuff Size: Large)   Pulse 73   Temp 98.6 F (37 C) (Oral)   Resp 16   Ht 6' (1.829 m)   Wt 228 lb 9.6 oz (103.7 kg)   SpO2 97%   BMI 31.00 kg/m   Physical Exam  Constitutional: He appears well-developed and well-nourished.  Skin: Skin is warm and dry.  Psychiatric: He has a normal mood and affect. His behavior is normal. Judgment and thought content normal.  Assessment and Plan :  1. Panic state as acute reaction to exceptional (gross) stress - Pt presents for f/u panic exacerbation. Things were improving for him until unforseen recent life stressors. Denies SI or HI. Plan to increase lexapro to 20mg /day. Stop Buspar as there is no clinical benefit. Con't seroquel 50mg  qhs and Ativan 1mg  bid PRN. He is likely to secure his own housing by next month, which will help symptoms greatly. Advised pt to speak with therapist for stress management techniques. RTC in 1 week to recheck and will consider returning to work at that time.  - escitalopram (LEXAPRO) 20 MG tablet; Take 1 tablet (20 mg total) by mouth daily.  Dispense: 30 tablet; Refill: 3 - LORazepam (ATIVAN) 1 MG tablet; TAKE 1/2 TO 1 TABLET BY  MOUTH EVERY 8 HOURS AS NEEDED FOR PANIC SYMPTOMS ONLY  Dispense: 60 tablet; Refill: 1  Matthew Amr Sturtevant, PA-C  Primary Care at [facility] 10/21/2017 2:21 PM  Please note: Portions of this report may have been transcribed using dragon voice recognition software. Every effort was made to ensure accuracy; however, inadvertent computerized transcription errors may be present.

## 2017-10-21 NOTE — Patient Instructions (Addendum)
Increase your dose of Lexapro to 5m/day  For therapy -- [facility] ([address] [place] -[place] ([phone] RELAXATION Consider practicing mindfulness meditation or other relaxation techniques such as deep breathing, prayer, yoga, tai chi, massage. See website mindful.org or the apps Headspace or Calm to help get started.    IF you received an x-ray today, you will receive an invoice from [facility]. Please contact [facility] at [phone] questions or concerns regarding your invoice.   IF you received labwork today, you will receive an invoice from [facility] Please contact [facility] at [phone] questions or concerns regarding your invoice.   Our billing staff will not be able to assist you with questions regarding bills from these companies.  You will be contacted with the lab results as soon as they are available. The fastest way to get your results is to activate your My Chart account. Instructions are located on the last page of this paperwork. If you have not heard from [place] the results in 2 weeks, please contact this office.

## 2017-10-23 ENCOUNTER — Telehealth: Payer: Self-pay | Admitting: Physician Assistant

## 2017-10-23 NOTE — Telephone Encounter (Signed)
Patient needed his FMLA updated from his last OV I have completed the forms they just need to be signed. I will place the forms in Whitney's box on 10/23/17 please return to the FMLA/Disability box at the 102 checkout desk within 5-7 business days. Thank you!

## 2017-10-28 ENCOUNTER — Ambulatory Visit: Payer: 59 | Admitting: Physician Assistant

## 2017-10-28 ENCOUNTER — Encounter: Payer: Self-pay | Admitting: Physician Assistant

## 2017-10-28 ENCOUNTER — Other Ambulatory Visit: Payer: Self-pay

## 2017-10-28 VITALS — BP 120/94 | HR 74 | Temp 98.2°F | Resp 16 | Ht 73.0 in | Wt 226.2 lb

## 2017-10-28 DIAGNOSIS — F411 Generalized anxiety disorder: Secondary | ICD-10-CM | POA: Diagnosis not present

## 2017-10-28 NOTE — Patient Instructions (Addendum)
Come back and see me Sept. 25  For therapy -- [facility] ([address] [place]) - [phone] [place] [address] [place]) [place], [phone]  RELAXATION Consider practicing mindfulness meditation or other relaxation techniques such as deep breathing, prayer, yoga, tai chi, massage. See website mindful.orgor the apps Headspaceor Calmto help get started.

## 2017-10-28 NOTE — Progress Notes (Signed)
   Matthew Hanson  MRN: [id number] DOB: Nov 20, 1975  PCP: Matthew Hanson, Matthew Lewan Whitney, PA-C  Subjective:  Pt is a 42 year old male who presents to clinic for follow-up depression and anxiety.  Pt is taking:  Ativan 1mg  bid PRN - helps "to a degree".  buspar 5mg  bid PRN - is not helping at all.  seroquel 50mg  qhs - Helping. Sleeping about 5 hours/night, which is up from 3-4 hours/night lexapro 20 mg qd -increase dose at last visit to 20 mg.  This is helping.  "My swirling brain is swirling less"  He has not seen a thereapist. Stress relief is going to the gym.  He put a down payment on a house and will move in next month.  He feels great about moving out of his parents home.  He plans to go to the beach for the next 2 days to decompress as his parents are increasing his stress level. He feels like he is ready to start work next week doing half days.  Review of Systems  Gastrointestinal: Negative for diarrhea, nausea and vomiting.  Psychiatric/Behavioral: Positive for dysphoric mood. Negative for self-injury and suicidal ideas. The patient is nervous/anxious.     Patient Active Problem List   Diagnosis Date Noted  . Panic disorder without agoraphobia 09/12/2015  . Essential hypertension 09/01/2014  . Anxiety state 09/01/2014  . Depression 09/01/2014    Current Outpatient Medications on File Prior to Visit  Medication Sig Dispense Refill  . BYSTOLIC 5 MG tablet TAKE ONE TABLET BY MOUTH DAILY 30 tablet 5  . escitalopram (LEXAPRO) 20 MG tablet Take 1 tablet (20 mg total) by mouth daily. 30 tablet 3  . LORazepam (ATIVAN) 1 MG tablet TAKE 1/2 TO 1 TABLET BY MOUTH EVERY 8 HOURS AS NEEDED FOR PANIC SYMPTOMS ONLY 60 tablet 1  . QUEtiapine (SEROQUEL) 25 MG tablet Take 2 tablets (50 mg total) by mouth at bedtime. 60 tablet 3   No current facility-administered medications on file prior to visit.     No Known Allergies   Objective:  BP (!) 120/94 (BP Location: Left Arm, Patient Position:  Sitting, Cuff Size: Large)   Pulse 74   Temp 98.2 F (36.8 C) (Oral)   Resp 16   Ht 6\' 1"  (1.854 m)   Wt 226 lb 3.2 oz (102.6 kg)   SpO2 95%   BMI 29.84 kg/m   Physical Exam  Constitutional: He is oriented to person, place, and time. He appears well-developed and well-nourished.  Cardiovascular: Normal rate and regular rhythm.  Neurological: He is alert and oriented to person, place, and time.  Skin: Skin is warm and dry.  Psychiatric: He has a normal mood and affect. His behavior is normal. Judgment and thought content normal.  Vitals reviewed.   Assessment and Plan :  1. Anxiety state -Slow, but steady improvement.  Lexapro 20 mg is helping.  Again encouraged making an appointment with a therapist.  He plans to go back to work half days next week.  He will follow-up with me on the 25th to reassess symptoms and see how working half days is going.  Matthew CollieWhitney Matthew Versteeg, PA-C  [facility] at [facility] 10/28/2017 3:14 PM  Please note: Portions of this report may have been transcribed using dragon voice recognition software. Every effort was made to ensure accuracy; however, inadvertent computerized transcription errors may be present.

## 2017-11-03 NOTE — Telephone Encounter (Signed)
Paperwork scanned and faxed on 11/03/17 °

## 2017-11-05 ENCOUNTER — Encounter: Payer: Self-pay | Admitting: Physician Assistant

## 2017-11-05 ENCOUNTER — Other Ambulatory Visit: Payer: Self-pay

## 2017-11-05 ENCOUNTER — Ambulatory Visit (INDEPENDENT_AMBULATORY_CARE_PROVIDER_SITE_OTHER): Payer: 59 | Admitting: Physician Assistant

## 2017-11-05 VITALS — BP 146/85 | HR 90 | Temp 98.7°F | Resp 18 | Ht 73.0 in | Wt 231.4 lb

## 2017-11-05 DIAGNOSIS — F411 Generalized anxiety disorder: Secondary | ICD-10-CM | POA: Diagnosis not present

## 2017-11-05 NOTE — Progress Notes (Signed)
   Matthew Hanson  MRN: [id number] DOB: October 10, 1975  PCP: Sebastian Ache, PA-C  Subjective:  Pt is a 42 year old male who presents to clinic for f/u depression and anxiety.  He is improving.   Pt is taking:  Ativan 1mg  bid PRN - helps "to a degree".  buspar 5mg  bid PRN - seroquel 50mg  qhs - Helping, but sleep is interrupted. Sleeping about <5 hours/night. lexapro 20 mg qd "my mind is still doing the tornado thing"   Moves into new house sometimes in October.  Has appt with therapist next month at [facility] Oct. 21.  Denies SI or HI.   Review of Systems  Constitutional: Negative for appetite change, fatigue and unexpected weight change.  Cardiovascular: Negative for chest pain and palpitations.  Psychiatric/Behavioral: Positive for dysphoric mood and sleep disturbance. Negative for self-injury and suicidal ideas. The patient is nervous/anxious.     Patient Active Problem List   Diagnosis Date Noted  . Panic disorder without agoraphobia 09/12/2015  . Essential hypertension 09/01/2014  . Anxiety state 09/01/2014  . Depression 09/01/2014    Current Outpatient Medications on File Prior to Visit  Medication Sig Dispense Refill  . busPIRone (BUSPAR) 5 MG tablet Take 5 mg by mouth 2 (two) times daily as needed.  3  . BYSTOLIC 5 MG tablet TAKE ONE TABLET BY MOUTH DAILY 30 tablet 5  . escitalopram (LEXAPRO) 20 MG tablet Take 1 tablet (20 mg total) by mouth daily. 30 tablet 3  . LORazepam (ATIVAN) 1 MG tablet TAKE 1/2 TO 1 TABLET BY MOUTH EVERY 8 HOURS AS NEEDED FOR PANIC SYMPTOMS ONLY 60 tablet 1  . QUEtiapine (SEROQUEL) 25 MG tablet Take 2 tablets (50 mg total) by mouth at bedtime. 60 tablet 3   No current facility-administered medications on file prior to visit.     No Known Allergies   Objective:  BP (!) 146/85   Pulse 90   Temp 98.7 F (37.1 C) (Oral)   Resp 18   Ht 6\' 1"  (1.854 m)   Wt 231 lb 6.4 oz (105 kg)   SpO2 94%   BMI 30.53 kg/m   Physical Exam    Constitutional: He is oriented to person, place, and time. He appears well-developed and well-nourished.  Cardiovascular: Normal rate and regular rhythm.  Neurological: He is alert and oriented to person, place, and time.  Skin: Skin is warm and dry.  Psychiatric: He has a normal mood and affect. His behavior is normal. Judgment and thought content normal.  Vitals reviewed.   Assessment and Plan :  1. Anxiety state - Pt here for f/u anxiety and depression. Clinically he is improving. He has appt with therapist next month. Plan to move into a new house next month. He is ready to start working half days. Will do this x 2 weeks, then RTC to reassess possibility of increasing to full time.    Marco Collie, PA-C  Primary Care at [facility] 11/05/2017 1:26 PM  Please note: Portions of this report may have been transcribed using dragon voice recognition software. Every effort was made to ensure accuracy; however, inadvertent computerized transcription errors may be present.

## 2017-11-05 NOTE — Patient Instructions (Addendum)
   I printed a copy of your half day work note for you to keep for your records.  Come back and see me in 2 weeks to reassess.   Keep meditating!  IF you received an x-ray today, you will receive an invoice from [facility]. Please contact [facility] at [phone] with questions or concerns regarding your invoice.   IF you received labwork today, you will receive an invoice from [facility]. Please contact [facility] at [phone] with questions or concerns regarding your invoice.   Our billing staff will not be able to assist you with questions regarding bills from these companies.  You will be contacted with the lab results as soon as they are available. The fastest way to get your results is to activate your My Chart account. Instructions are located on the last page of this paperwork. If you have not heard from [place] regarding the results in 2 weeks, please contact this office.

## 2017-11-10 ENCOUNTER — Ambulatory Visit: Payer: Self-pay | Admitting: *Deleted

## 2017-11-10 NOTE — Telephone Encounter (Signed)
Pt called wanting an appointment regarding swelling that started in his right foot and then a day later went also to the left foot. He states that it covered up his veins and up to his ankles but still able to see his ankle. He only had pain in his right pinky toe and it was redder than the rest of his foot. Started on Saturday, so he kept his foot elevated and drank a lot of water. The swelling is down right now.  No shortness of breath or fever or pain in his calves when walking.  He had a brother that died with a blood clot in his legs and he is concerned about what this may be.  Appointment scheduled per protocol. Pt advised to call back for increased in swelling with shortness of breath, red streaks down his legs, pain walking or any other symptoms. Pt voiced understanding.  Reason for Disposition . [1] MILD swelling of both ankles (i.e., pedal edema) AND [2] new onset or worsening  Answer Assessment - Initial Assessment Questions 1. LOCATION: "Which joint is swollen?"     ankle 2. ONSET: "When did the swelling start?"     Started on Saturday 3. SIZE: "How large is the swelling?"     Shoes were tight 4. PAIN: "Is there any pain?" If so, ask: "How bad is it?" (Scale 1-10; or mild, moderate, severe)     Pain in his right pinky toe. 5. CAUSE: "What do you think caused the swollen joint?"     Not sure 6. OTHER SYMPTOMS: "Do you have any other symptoms?" (e.g., fever, chest pain, difficulty breathing, calf pain)     no  Protocols used: LEG SWELLING AND EDEMA-A-AH, ANKLE SWELLING-A-AH

## 2017-11-12 ENCOUNTER — Encounter: Payer: Self-pay | Admitting: Family Medicine

## 2017-11-12 ENCOUNTER — Ambulatory Visit (HOSPITAL_COMMUNITY)
Admission: RE | Admit: 2017-11-12 | Discharge: 2017-11-12 | Disposition: A | Discharge status: Home / Self Care | Payer: 59 | Source: Ambulatory Visit | Attending: Family Medicine | Admitting: Family Medicine

## 2017-11-12 ENCOUNTER — Other Ambulatory Visit: Payer: Self-pay

## 2017-11-12 ENCOUNTER — Other Ambulatory Visit: Payer: Self-pay | Admitting: *Deleted

## 2017-11-12 ENCOUNTER — Ambulatory Visit: Payer: 59 | Admitting: Family Medicine

## 2017-11-12 VITALS — BP 154/87 | HR 89 | Temp 98.0°F | Resp 16 | Ht 73.82 in | Wt 233.0 lb

## 2017-11-12 DIAGNOSIS — R600 Localized edema: Secondary | ICD-10-CM | POA: Diagnosis not present

## 2017-11-12 DIAGNOSIS — Z8249 Family history of ischemic heart disease and other diseases of the circulatory system: Secondary | ICD-10-CM

## 2017-11-12 DIAGNOSIS — I83811 Varicose veins of right lower extremities with pain: Secondary | ICD-10-CM | POA: Diagnosis not present

## 2017-11-12 DIAGNOSIS — N289 Disorder of kidney and ureter, unspecified: Secondary | ICD-10-CM | POA: Diagnosis not present

## 2017-11-12 DIAGNOSIS — I10 Essential (primary) hypertension: Secondary | ICD-10-CM

## 2017-11-12 DIAGNOSIS — R609 Edema, unspecified: Secondary | ICD-10-CM | POA: Diagnosis not present

## 2017-11-12 NOTE — Progress Notes (Signed)
RLE venous duplex prelim: negative for DVT. Farrel Demark, RDMS, RVT   Called results to Dr. Alwyn Ren

## 2017-11-12 NOTE — Addendum Note (Signed)
Addended by: Baldwin Crown D on: 11/12/2017 03:01 PM   Modules accepted: Orders

## 2017-11-12 NOTE — Patient Instructions (Addendum)
Go directly to admitting at [facility].   For an ultrasound of Right Leg  Avoid excessive salt.  Also try to cut back on the general eating again so you do not keep gaining so much weight.  Venous ultrasound is being ordered for you.  Elevate the legs if swelling.  If you decide to pursue treatment for your varicose veins I recommend Consuela Mimes, MD  [facility]  [address] [place], [place], [place] [address]  : [phone] [email or web address]  We will check blood for your kidney function and let you know the results of those tests in the near future.  If you do not hear from [place] over the course of a week or so please call back.   If you have lab work done today you will be contacted with your lab results within the next 2 weeks.  If you have not heard from [place] then please contact us. The fastest way to get your results is to register for My Chart.   IF you received an x-ray today, you will receive an invoice from [facility]. Please contact [facility] at [phone] with questions or concerns regarding your invoice.   IF you received labwork today, you will receive an invoice from [place]. Please contact [facility] at [phone] with questions or concerns regarding your invoice.   Our billing staff will not be able to assist you with questions regarding bills from these companies.  You will be contacted with the lab results as soon as they are available. The fastest way to get your results is to activate your My Chart account. Instructions are located on the last page of this paperwork. If you have not heard from [place] regarding the results in 2 weeks, please contact this office.

## 2017-11-12 NOTE — Progress Notes (Signed)
Patient ID: Matthew Hanson, male    DOB: 1975/05/31  Age: 42 y.o. MRN: [id number]  Chief Complaint  Patient presents with  . Foot Swelling    both feet was swollen x 1 week, pt states he became concerned and would like then to be examed.     Subjective:   Patient is here with the complaint of having ankle edema.  It happened over the last week especially in the right ankle.  It comes and goes.  He has been under stressful changes in life and has gained about 30 pounds in the last 3 months.  He does eat a lot of salt.  He has a history of high blood pressure and takes his medicines faithfully.  His grandfather had Bright disease.  Sometimes he does not drink enough fluids, other times he does.  He has chronic varicose veins in his right leg more than the left.  His leg aches and when he stands on it.  However with the edema is not any calf or leg pain particularly.  Current allergies, medications, problem list, past/family and social histories reviewed.  Objective:  BP (!) 154/87   Pulse 89   Temp 98 F (36.7 C) (Other (Comment))   Resp 16   Ht 6' 1.82" (1.875 m)   Wt 233 lb (105.7 kg)   SpO2 96%   BMI 30.06 kg/m   Trace of edema in both ankles right now.  Photo from a couple days ago this showed significant edema.  Prominent varicose veins on the right leg with mild tenderness.  No major calf tenderness with negative Homans sign and negative tenderness in the medial thigh.  Assessment & Plan:   Assessment: 1. Localized edema   2. Renal insufficiency, mild   3. Essential hypertension   4. Varicose veins of right lower extremity with pain   5. Family history of DVT       Plan: After discussing his brothers death from a DVT I decided to go ahead and get an ultrasound.  See instructions.  Orders Placed This Encounter  Procedures  . US Venous Img Lower Unilateral Right    Can do either at [facility] or [place] or whichever center he can get into.    Standing Status:   Future    Standing Expiration Date:   01/13/2019    Order Specific Question:   Reason for Exam (SYMPTOM  OR DIAGNOSIS REQUIRED)    Answer:   Edema right leg, family history of DVT, rule out DVT    Order Specific Question:   Preferred imaging location?    Answer:   External    Order Specific Question:   Call Results- Best Contact Number?    Answer:   Dr. Janace Hoard, [phone]  . BASIC METABOLIC PANEL WITH GFR    No orders of the defined types were placed in this encounter.        Patient Instructions   Avoid excessive salt.  Also try to cut back on the general eating again so you do not keep gaining so much weight.  Venous ultrasound is being ordered for you.  Elevate the legs if swelling.  If you decide to pursue treatment for your varicose veins I recommend Consuela Mimes, MD  [facility]  [address] [place], [place], [place] [address]  : [phone] [email or web address]  We will check blood for your kidney function and let you know the results of those tests in the near future.  If you do  not hear from [place] over the course of a week or so please call back.   If you have lab work done today you will be contacted with your lab results within the next 2 weeks.  If you have not heard from [place] then please contact us. The fastest way to get your results is to register for My Chart.   IF you received an x-ray today, you will receive an invoice from [facility]. Please contact [facility] at [phone] with questions or concerns regarding your invoice.   IF you received labwork today, you will receive an invoice from [place]. Please contact [facility] at [phone] with questions or concerns regarding your invoice.   Our billing staff will not be able to assist you with questions regarding bills from these companies.  You will be contacted with the lab results as soon as they are available. The fastest way to get your results is to activate your My Chart account.  Instructions are located on the last page of this paperwork. If you have not heard from [place] regarding the results in 2 weeks, please contact this office.         Return if symptoms worsen or fail to improve.   Janace Hoard, MD 11/12/2017

## 2017-11-13 ENCOUNTER — Telehealth: Payer: Self-pay | Admitting: Family Medicine

## 2017-11-13 LAB — BMP8+EGFR
BUN / CREAT RATIO: 12 (ref 9–20)
BUN: 11 mg/dL (ref 6–24)
CO2: 25 mmol/L (ref 20–29)
CREATININE: 0.91 mg/dL (ref 0.76–1.27)
Calcium: 9.5 mg/dL (ref 8.7–10.2)
Chloride: 100 mmol/L (ref 96–106)
GFR calc Af Amer: 120 mL/min/{1.73_m2} (ref >59)
GFR calc non Af Amer: 104 mL/min/{1.73_m2} (ref >59)
GLUCOSE: 95 mg/dL (ref 65–99)
Potassium: 4.1 mmol/L (ref 3.5–5.2)
Sodium: 138 mmol/L (ref 134–144)

## 2017-11-13 NOTE — Telephone Encounter (Signed)
Called pt to see if we can get his u/s venous scheduled and to see where pt wants to go I spoke with pt father and he stated that he will tell pt to call back

## 2017-11-18 ENCOUNTER — Encounter: Payer: Self-pay | Admitting: Family Medicine

## 2017-11-18 ENCOUNTER — Ambulatory Visit: Payer: 59 | Admitting: Family Medicine

## 2017-11-18 ENCOUNTER — Other Ambulatory Visit: Payer: Self-pay

## 2017-11-18 VITALS — BP 115/72 | HR 71 | Temp 98.7°F | Resp 17 | Ht 73.82 in | Wt 233.7 lb

## 2017-11-18 DIAGNOSIS — B9689 Other specified bacterial agents as the cause of diseases classified elsewhere: Secondary | ICD-10-CM | POA: Diagnosis not present

## 2017-11-18 DIAGNOSIS — H60391 Other infective otitis externa, right ear: Secondary | ICD-10-CM

## 2017-11-18 DIAGNOSIS — H1031 Unspecified acute conjunctivitis, right eye: Secondary | ICD-10-CM

## 2017-11-18 DIAGNOSIS — J019 Acute sinusitis, unspecified: Secondary | ICD-10-CM

## 2017-11-18 MED ORDER — MOMETASONE FUROATE 50 MCG/ACT NA SUSP: 2.0000 | Freq: Every day | NASAL | 12 refills | Status: DC

## 2017-11-18 MED ORDER — AMOXICILLIN-POT CLAVULANATE 875-125 MG PO TABS: 1.0000 | ORAL_TABLET | Freq: Two times a day (BID) | ORAL | 0 refills | Status: DC

## 2017-11-18 NOTE — Patient Instructions (Addendum)
Try over the counter Theraflu at bedtime for cough and congestion    Antibiotic - You have received the antibiotic Augmentin. It may take several days before you feel any benefit from this medicine. Be sure to take all the medication prescribed, even if you are feeling better before it is finished. Do not drink alcohol while taking antibiotics.  It is unknown whether you will develop an adverse reaction (diarrhea, rash, nausea or vomiting) or an allergic reaction (trouble breathing, anaphylaxis).        If you have lab work done today you will be contacted with your lab results within the next 2 weeks.  If you have not heard from [place] then please contact us. The fastest way to get your results is to register for My Chart.   IF you received an x-ray today, you will receive an invoice from [facility]. Please contact [facility] at [phone] with questions or concerns regarding your invoice.   IF you received labwork today, you will receive an invoice from [place]. Please contact [facility] at [phone] with questions or concerns regarding your invoice.   Our billing staff will not be able to assist you with questions regarding bills from these companies.  You will be contacted with the lab results as soon as they are available. The fastest way to get your results is to activate your My Chart account. Instructions are located on the last page of this paperwork. If you have not heard from [place] regarding the results in 2 weeks, please contact this office.     Sinusitis, Adult Sinusitis is soreness and inflammation of your sinuses. Sinuses are hollow spaces in the bones around your face. Your sinuses are located:  Around your eyes.  In the middle of your forehead.  Behind your nose.  In your cheekbones.  Your sinuses and nasal passages are lined with a stringy fluid (mucus). Mucus normally drains out of your sinuses. When your nasal tissues become inflamed or swollen,  the mucus can become trapped or blocked so air cannot flow through your sinuses. This allows bacteria, viruses, and funguses to grow, which leads to infection. Sinusitis can develop quickly and last for 7?10 days (acute) or for more than 12 weeks (chronic). Sinusitis often develops after a cold. What are the causes? This condition is caused by anything that creates swelling in the sinuses or stops mucus from draining, including:  Allergies.  Asthma.  Bacterial or viral infection.  Abnormally shaped bones between the nasal passages.  Nasal growths that contain mucus (nasal polyps).  Narrow sinus openings.  Pollutants, such as chemicals or irritants in the air.  A foreign object stuck in the nose.  A fungal infection. This is rare.  What increases the risk? The following factors may make you more likely to develop this condition:  Having allergies or asthma.  Having had a recent cold or respiratory tract infection.  Having structural deformities or blockages in your nose or sinuses.  Having a weak immune system.  Doing a lot of swimming or diving.  Overusing nasal sprays.  Smoking.  What are the signs or symptoms? The main symptoms of this condition are pain and a feeling of pressure around the affected sinuses. Other symptoms include:  Upper toothache.  Earache.  Headache.  Bad breath.  Decreased sense of smell and taste.  A cough that may get worse at night.  Fatigue.  Fever.  Thick drainage from your nose. The drainage is often green and it may  contain pus (purulent).  Stuffy nose or congestion.  Postnasal drip. This is when extra mucus collects in the throat or back of the nose.  Swelling and warmth over the affected sinuses.  Sore throat.  Sensitivity to light.  How is this diagnosed? This condition is diagnosed based on symptoms, a medical history, and a physical exam. To find out if your condition is acute or chronic, your health care  provider may:  Look in your nose for signs of nasal polyps.  Tap over the affected sinus to check for signs of infection.  View the inside of your sinuses using an imaging device that has a light attached (endoscope).  If your health care provider suspects that you have chronic sinusitis, you may also:  Be tested for allergies.  Have a sample of mucus taken from your nose (nasal culture) and checked for bacteria.  Have a mucus sample examined to see if your sinusitis is related to an allergy.  If your sinusitis does not respond to treatment and it lasts longer than 8 weeks, you may have an MRI or CT scan to check your sinuses. These scans also help to determine how severe your infection is. In rare cases, a bone biopsy may be done to rule out more serious types of fungal sinus disease. How is this treated? Treatment for sinusitis depends on the cause and whether your condition is chronic or acute. If a virus is causing your sinusitis, your symptoms will go away on their own within 10 days. You may be given medicines to relieve your symptoms, including:  Topical nasal decongestants. They shrink swollen nasal passages and let mucus drain from your sinuses.  Antihistamines. These drugs block inflammation that is triggered by allergies. This can help to ease swelling in your nose and sinuses.  Topical nasal corticosteroids. These are nasal sprays that ease inflammation and swelling in your nose and sinuses.  Nasal saline washes. These rinses can help to get rid of thick mucus in your nose.  If your condition is caused by bacteria, you will be given an antibiotic medicine. If your condition is caused by a fungus, you will be given an antifungal medicine. Surgery may be needed to correct underlying conditions, such as narrow nasal passages. Surgery may also be needed to remove polyps. Follow these instructions at home: Medicines  Take, use, or apply over-the-counter and prescription  medicines only as told by your health care provider. These may include nasal sprays.  If you were prescribed an antibiotic medicine, take it as told by your health care provider. Do not stop taking the antibiotic even if you start to feel better. Hydrate and Humidify  Drink enough water to keep your urine clear or pale yellow. Staying hydrated will help to thin your mucus.  Use a cool mist humidifier to keep the humidity level in your home above 50%.  Inhale steam for 10-15 minutes, 3-4 times a day or as told by your health care provider. You can do this in the bathroom while a hot shower is running.  Limit your exposure to cool or dry air. Rest  Rest as much as possible.  Sleep with your head raised (elevated).  Make sure to get enough sleep each night. General instructions  Apply a warm, moist washcloth to your face 3-4 times a day or as told by your health care provider. This will help with discomfort.  Wash your hands often with soap and water to reduce your exposure to viruses and  other germs. If soap and water are not available, use hand sanitizer.  Do not smoke. Avoid being around people who are smoking (secondhand smoke).  Keep all follow-up visits as told by your health care provider. This is important. Contact a health care provider if:  You have a fever.  Your symptoms get worse.  Your symptoms do not improve within 10 days. Get help right away if:  You have a severe headache.  You have persistent vomiting.  You have pain or swelling around your face or eyes.  You have vision problems.  You develop confusion.  Your neck is stiff.  You have trouble breathing. This information is not intended to replace advice given to you by your health care provider. Make sure you discuss any questions you have with your health care provider. Document Released: 01/28/2005 Document Revised: 09/24/2015 Document Reviewed: 11/23/2014 Elsevier Interactive Patient Education   [employer].

## 2017-11-18 NOTE — Progress Notes (Signed)
Chief Complaint  Patient presents with  . head/chest congestion    yellow/green drainage x 1 1/2 weeks, coughing nonstop and hard to sleep due to coughing, right ear is stopped up and feels like Matthew lot of pressure    HPI  Pt reports that he has been having congestion, cough and green sputum He even had yellow-green mucus from the right eye and right ear pain After Matthew coughing fit he felt pressure on the right side of the head and it hurts behind the ear He has not been sleeping at night due to coughing and drainage   Past Medical History:  Diagnosis Date  . Anxiety   . Hypertension     Current Outpatient Medications  Medication Sig Dispense Refill  . busPIRone (BUSPAR) 5 MG tablet Take 5 mg by mouth 2 (two) times daily as needed.  3  . BYSTOLIC 5 MG tablet TAKE ONE TABLET BY MOUTH DAILY 30 tablet 5  . escitalopram (LEXAPRO) 20 MG tablet Take 1 tablet (20 mg total) by mouth daily. 30 tablet 3  . LORazepam (ATIVAN) 1 MG tablet TAKE 1/2 TO 1 TABLET BY MOUTH EVERY 8 HOURS AS NEEDED FOR PANIC SYMPTOMS ONLY 60 tablet 1  . QUEtiapine (SEROQUEL) 25 MG tablet Take 2 tablets (50 mg total) by mouth at bedtime. 60 tablet 3  . amoxicillin-clavulanate (AUGMENTIN) 875-125 MG tablet Take 1 tablet by mouth 2 (two) times daily. 20 tablet 0  . mometasone (NASONEX) 50 MCG/ACT nasal spray Place 2 sprays into the nose daily. 17 g 12   No current facility-administered medications for this visit.     Allergies: No Known Allergies  No past surgical history on file.  Social History   Socioeconomic History  . Marital status: Married    Spouse name: Not on file  . Number of children: Not on file  . Years of education: Not on file  . Highest education level: Not on file  Occupational History  . Not on file  Social Needs  . Financial resource strain: Not on file  . Food insecurity:    Worry: Not on file    Inability: Not on file  . Transportation needs:    Medical: Not on file    Non-medical:  Not on file  Tobacco Use  . Smoking status: Never Smoker  . Smokeless tobacco: Never Used  Substance and Sexual Activity  . Alcohol use: Yes    Alcohol/week: 5.0 standard drinks    Types: 5 Standard drinks or equivalent per week  . Drug use: No  . Sexual activity: Not on file  Lifestyle  . Physical activity:    Days per week: Not on file    Minutes per session: Not on file  . Stress: Not on file  Relationships  . Social connections:    Talks on phone: Not on file    Gets together: Not on file    Attends religious service: Not on file    Active member of club or organization: Not on file    Attends meetings of clubs or organizations: Not on file    Relationship status: Not on file  Other Topics Concern  . Not on file  Social History Narrative  . Not on file    Family History  Problem Relation Age of Onset  . Diabetes Father   . Heart disease Father   . Hyperlipidemia Father      ROS Review of Systems See HPI Constitution: No fevers or chills No  malaise No diaphoresis Skin: No rash or itching Eyes: no blurry vision, no double vision GU: no dysuria or hematuria Neuro: no dizziness or headaches all others reviewed and negative   Objective: Vitals:   11/18/17 1410  BP: 115/72  Pulse: 71  Resp: 17  Temp: 98.7 F (37.1 C)  TempSrc: Oral  SpO2: 95%  Weight: 233 lb 11 oz (106 kg)  Height: 6' 1.82" (1.875 m)    Physical Exam General: alert, oriented, in NAD Head: normocephalic, atraumatic, no sinus tenderness Eyes: EOM intact, no scleral icterus or conjunctival injection on left,  Right eye with purulent drainage Ears: TM clear on left, right ear external canal with erythema Nose: mucosa +erythematous, +edematous Throat: no pharyngeal exudate or erythema Lymph: no posterior auricular, submental or cervical lymph adenopathy Heart: normal rate, normal sinus rhythm, no murmurs Lungs: clear to auscultation bilaterally, no wheezing   Assessment and  Plan Matthew Hanson was seen today for head/chest congestion.  Diagnoses and all orders for this visit:  Acute bacterial sinusitis -     amoxicillin-clavulanate (AUGMENTIN) 875-125 MG tablet; Take 1 tablet by mouth 2 (two) times daily.  Acute bacterial conjunctivitis of right eye -     amoxicillin-clavulanate (AUGMENTIN) 875-125 MG tablet; Take 1 tablet by mouth 2 (two) times daily.  Other infective acute otitis externa of right ear -     amoxicillin-clavulanate (AUGMENTIN) 875-125 MG tablet; Take 1 tablet by mouth 2 (two) times daily.  Other orders -     mometasone (NASONEX) 50 MCG/ACT nasal spray; Place 2 sprays into the nose daily.  Will treat empirically for sinusitis, conjunctivitis and otitis externa Discussed adding probiotic capsule Pt to take otc theraflu at bedtime Supportive care discussed   Matthew Hanson Matthew Hanson

## 2017-11-19 ENCOUNTER — Encounter: Payer: Self-pay | Admitting: Radiology

## 2017-11-20 ENCOUNTER — Ambulatory Visit: Payer: 59 | Admitting: Physician Assistant

## 2017-11-24 ENCOUNTER — Ambulatory Visit: Payer: 59 | Admitting: Emergency Medicine

## 2017-12-01 ENCOUNTER — Ambulatory Visit (INDEPENDENT_AMBULATORY_CARE_PROVIDER_SITE_OTHER): Payer: 59 | Admitting: Psychology

## 2017-12-01 DIAGNOSIS — F411 Generalized anxiety disorder: Secondary | ICD-10-CM | POA: Diagnosis not present

## 2017-12-22 ENCOUNTER — Other Ambulatory Visit: Payer: Self-pay | Admitting: Physician Assistant

## 2017-12-22 DIAGNOSIS — I10 Essential (primary) hypertension: Secondary | ICD-10-CM

## 2017-12-25 NOTE — Telephone Encounter (Signed)
Approved 90 day supply with 1 refill. Requested Prescriptions  Pending Prescriptions Disp Refills  . BYSTOLIC 5 MG tablet [Pharmacy Med Name: BYSTOLIC 5 MG TABLET] 90 tablet 1    Sig: TAKE 1 TABLET EVERY DAY     Cardiovascular:  Beta Blockers Passed - 12/25/2017 10:54 AM      Passed - Last BP in normal range    BP Readings from Last 1 Encounters:  11/18/17 115/72         Passed - Last Heart Rate in normal range    Pulse Readings from Last 1 Encounters:  11/18/17 71         Passed - Valid encounter within last 6 months    Recent Outpatient Visits          1 month ago Acute bacterial sinusitis   [facility] Manus RuddZoe A, MD   1 month ago Localized edema   [facility], Sandria Balesavid H, MD   1 month ago Anxiety state   [name], Madelaine BhatElizabeth Whitney, PA-C   1 month ago Anxiety state   [name], [place], PA-C   2 months ago Panic state as acute reaction to exceptional (gross) stress   [name] Madelaine BhatElizabeth [place], [place]

## 2017-12-25 NOTE — Telephone Encounter (Signed)
Patient is completely out of medication. Please advise.

## 2017-12-31 ENCOUNTER — Ambulatory Visit: Payer: Self-pay | Admitting: Psychology

## 2018-01-02 ENCOUNTER — Other Ambulatory Visit: Payer: Self-pay

## 2018-01-02 ENCOUNTER — Ambulatory Visit: Payer: 59 | Admitting: Family Medicine

## 2018-01-02 ENCOUNTER — Encounter: Payer: Self-pay | Admitting: Family Medicine

## 2018-01-02 VITALS — BP 128/89 | HR 55 | Temp 98.3°F | Resp 17 | Ht 73.82 in | Wt 238.2 lb

## 2018-01-02 DIAGNOSIS — R51 Headache: Secondary | ICD-10-CM

## 2018-01-02 DIAGNOSIS — R519 Headache, unspecified: Secondary | ICD-10-CM

## 2018-01-02 DIAGNOSIS — J029 Acute pharyngitis, unspecified: Secondary | ICD-10-CM | POA: Diagnosis not present

## 2018-01-02 DIAGNOSIS — J019 Acute sinusitis, unspecified: Secondary | ICD-10-CM

## 2018-01-02 DIAGNOSIS — F419 Anxiety disorder, unspecified: Secondary | ICD-10-CM

## 2018-01-02 DIAGNOSIS — B9689 Other specified bacterial agents as the cause of diseases classified elsewhere: Secondary | ICD-10-CM

## 2018-01-02 DIAGNOSIS — F410 Panic disorder [episodic paroxysmal anxiety] without agoraphobia: Secondary | ICD-10-CM

## 2018-01-02 DIAGNOSIS — F430 Acute stress reaction: Secondary | ICD-10-CM

## 2018-01-02 LAB — POCT RAPID STREP A (OFFICE): Rapid Strep A Screen: NEGATIVE

## 2018-01-02 MED ORDER — CITALOPRAM HYDROBROMIDE 40 MG PO TABS: 40.0000 mg | ORAL_TABLET | Freq: Every day | ORAL | 3 refills | Status: DC

## 2018-01-02 MED ORDER — LORAZEPAM 2 MG PO TABS: 1.0000 mg | ORAL_TABLET | Freq: Every day | ORAL | 1 refills | Status: DC | PRN

## 2018-01-02 MED ORDER — BUSPIRONE HCL 15 MG PO TABS: 15.0000 mg | ORAL_TABLET | Freq: Three times a day (TID) | ORAL | 0 refills | Status: DC

## 2018-01-02 MED ORDER — AMOXICILLIN-POT CLAVULANATE 875-125 MG PO TABS: 1.0000 | ORAL_TABLET | Freq: Two times a day (BID) | ORAL | 0 refills | Status: DC

## 2018-01-02 MED ORDER — PREDNISONE 10 MG PO TABS: ORAL_TABLET | ORAL | 0 refills | Status: AC

## 2018-01-02 NOTE — Progress Notes (Signed)
Chief Complaint  Patient presents with  . Sore Throat    x 2 weeks  . Cough    x 2 weeks,unable to sleejp due ha's, drainage white, coughing episodes.  Otc ibuprofen, robitussin and cold and hot sweats  . Medication Refill    lorazepam 1 mg and would like increased to 2 mg     HPI   Patient reports that he has not gotten over his sore throat symptoms with pain behind the ear He completed Augmentin He called out of work and slept a few hours He has throat pain He states that he had laryngitis and lost his voice He states that the cough is persistent and he reports that he sometimes cough up some mucus He took ibuprofen for his headache and this helped him These past two weeks have been very bad He reports that it is going around at work  He reports fatigue but poor resting due to his sinus issues   Medication Refill Patient has a history of social anxiety and panic attacks Denies suicidal thoughts or depression Needs refill of his anxiety  meds  Still has a lot of anxiety despite lexapro He takes seroquel for sleep buspar 5mg  did not help him in the past He takes about 0.25mg  of ativan as needed He bites one tablet and it lasts him a long time   Depression screen [facility] 2/9 11/18/2017 11/12/2017 11/05/2017 10/28/2017 10/21/2017  Decreased Interest 0 0 0 0 0  Down, Depressed, Hopeless 0 0 0 0 0  PHQ - 2 Score 0 0 0 0 0    GAD 7 : Generalized Anxiety Score 01/02/2018  Nervous, Anxious, on Edge 3  Control/stop worrying 3  Worry too much - different things 3  Trouble relaxing 3  Restless 3  Easily annoyed or irritable 3  Afraid - awful might happen 1  Total GAD 7 Score 19  Anxiety Difficulty Somewhat difficult      Past Medical History:  Diagnosis Date  . Anxiety   . Hypertension     Current Outpatient Medications  Medication Sig Dispense Refill  . LORazepam (ATIVAN) 2 MG tablet Take 0.5 tablets (1 mg total) by mouth daily as needed for anxiety. 30 tablet 1  .  amoxicillin-clavulanate (AUGMENTIN) 875-125 MG tablet Take 1 tablet by mouth 2 (two) times daily. 20 tablet 0  . busPIRone (BUSPAR) 15 MG tablet Take 1 tablet (15 mg total) by mouth 3 (three) times daily. 90 tablet 0  . BYSTOLIC 5 MG tablet TAKE 1 TABLET EVERY DAY 90 tablet 1  . citalopram (CELEXA) 40 MG tablet Take 1 tablet (40 mg total) by mouth daily. 30 tablet 3  . escitalopram (LEXAPRO) 20 MG tablet Take 1 tablet (20 mg total) by mouth daily. 30 tablet 3  . mometasone (NASONEX) 50 MCG/ACT nasal spray Place 2 sprays into the nose daily. 17 g 12  . QUEtiapine (SEROQUEL) 25 MG tablet Take 2 tablets (50 mg total) by mouth at bedtime. 60 tablet 3   No current facility-administered medications for this visit.     Allergies: No Known Allergies  No past surgical history on file.  Social History   Socioeconomic History  . Marital status: Divorced    Spouse name: Not on file  . Number of children: Not on file  . Years of education: Not on file  . Highest education level: Not on file  Occupational History  . Not on file  Social Needs  . Financial resource strain:  Not on file  . Food insecurity:    Worry: Not on file    Inability: Not on file  . Transportation needs:    Medical: Not on file    Non-medical: Not on file  Tobacco Use  . Smoking status: Never Smoker  . Smokeless tobacco: Never Used  Substance and Sexual Activity  . Alcohol use: Yes    Alcohol/week: 5.0 standard drinks    Types: 5 Standard drinks or equivalent per week  . Drug use: No  . Sexual activity: Not on file  Lifestyle  . Physical activity:    Days per week: Not on file    Minutes per session: Not on file  . Stress: Not on file  Relationships  . Social connections:    Talks on phone: Not on file    Gets together: Not on file    Attends religious service: Not on file    Active member of club or organization: Not on file    Attends meetings of clubs or organizations: Not on file    Relationship status:  Not on file  Other Topics Concern  . Not on file  Social History Narrative  . Not on file    Family History  Problem Relation Age of Onset  . Diabetes Father   . Heart disease Father   . Hyperlipidemia Father      ROS Review of Systems See HPI Constitution: No fevers or chills No malaise No diaphoresis Skin: No rash or itching Eyes: no blurry vision, no double vision GU: no dysuria or hematuria Neuro: no dizziness, + headaches all others reviewed and negative   Objective: Vitals:   01/02/18 1253  BP: 128/89  Pulse: (!) 55  Resp: 17  Temp: 98.3 F (36.8 C)  TempSrc: Oral  SpO2: 94%  Weight: 238 lb 3.2 oz (108 kg)  Height: 6' 1.82" (1.875 m)    Physical Exam General: alert, oriented, in NAD Head: normocephalic, atraumatic, ++ sinus tenderness Eyes: EOM intact, no scleral icterus or conjunctival injection Ears: TM clear bilaterally Nose: mucosa nonerythematous, nonedematous Throat: no pharyngeal exudate or erythema Lymph: no posterior auricular, submental or cervical lymph adenopathy Heart: normal rate, normal sinus rhythm, no murmurs Lungs: clear to auscultation bilaterally, no wheezing   Assessment and Plan Matthew Hanson was seen today for sore throat, cough and medication refill.  Diagnoses and all orders for this visit:  Sore throat- strep negative -     POCT rapid strep A  Sinus headache  Acute bacterial sinusitis- will treat with abx and prednisone  -     predniSONE (DELTASONE) 10 MG tablet; Take 6 tablets (60 mg total) by mouth daily with breakfast for 1 day, THEN 5 tablets (50 mg total) daily with breakfast for 1 day, THEN 4 tablets (40 mg total) daily with breakfast for 1 day, THEN 3 tablets (30 mg total) daily with breakfast for 1 day, THEN 2 tablets (20 mg total) daily with breakfast for 1 day, THEN 1 tablet (10 mg total) daily with breakfast for 1 day. -     amoxicillin-clavulanate (AUGMENTIN) 875-125 MG tablet; Take 1 tablet by mouth 2 (two) times  daily.  Severe anxiety- will prescribe buspar at a high dose with plan to taper off ativan -     busPIRone (BUSPAR) 15 MG tablet; Take 1 tablet (15 mg total) by mouth 3 (three) times daily. -     citalopram (CELEXA) 40 MG tablet; Take 1 tablet (40 mg total) by mouth daily.  Panic state as acute reaction to exceptional (gross) stress- will d/c lexapro and start celexa since it has a higher dose  -     LORazepam (ATIVAN) 2 MG tablet; Take 0.5 tablets (1 mg total) by mouth daily as needed for anxiety. -     citalopram (CELEXA) 40 MG tablet; Take 1 tablet (40 mg total) by mouth daily.     Zoe A Stallings

## 2018-01-14 ENCOUNTER — Ambulatory Visit: Payer: Self-pay | Admitting: Psychology

## 2018-02-28 ENCOUNTER — Other Ambulatory Visit: Payer: Self-pay | Admitting: Family Medicine

## 2018-02-28 DIAGNOSIS — F419 Anxiety disorder, unspecified: Secondary | ICD-10-CM

## 2018-03-02 NOTE — Telephone Encounter (Signed)
Requested medication (s) are due for refill today: yes  Requested medication (s) are on the active medication list: yes  Last refill:  01/02/18 for 90 tabs  Future visit scheduled: no  Notes to clinic:  Med can not be delegated. No urine drug screen in the last 360 days.  Last office visit was 01/02/18.  Requested Prescriptions  Pending Prescriptions Disp Refills   busPIRone (BUSPAR) 15 MG tablet [Pharmacy Med Name: BUSPIRONE HCL 15 MG TABLET] 90 tablet 0    Sig: Take 1 tablet (15 mg total) by mouth 3 (three) times daily.     Not Delegated - Psychiatry:  Anxiolytics/Hypnotics Failed - 02/28/2018 12:10 PM      Failed - This refill cannot be delegated      Failed - Urine Drug Screen completed in last 360 days.      Passed - Valid encounter within last 6 months    Recent Outpatient Visits          1 month ago Sore throat   Primary Care at [facility], [place] A, MD   3 months ago Acute bacterial sinusitis   Primary Care at [facility], Manus Rudd, MD   3 months ago Localized edema   [facility] Care at [facility], Sandria Bales, MD   3 months ago Anxiety state   Primary Care at [facility], Madelaine Bhat, PA-C   4 months ago Anxiety state   Primary Care at [facility], [place], [place]

## 2018-03-02 NOTE — Telephone Encounter (Signed)
Patient is requesting a refill of the following medications: Requested Prescriptions   Pending Prescriptions Disp Refills  . busPIRone (BUSPAR) 15 MG tablet [Pharmacy Med Name: BUSPIRONE HCL 15 MG TABLET] 90 tablet 0    Sig: TAKE 1 TABLET (15 MG TOTAL) BY MOUTH 3 (THREE) TIMES DAILY.

## 2018-04-27 ENCOUNTER — Telehealth: Payer: Self-pay | Admitting: Family Medicine

## 2018-04-27 NOTE — Telephone Encounter (Signed)
Copied from CRM [id number]. Topic: Quick Communication - See Telephone Encounter >> Apr 27, 2018  3:07 PM Aretta Nip wrote: CRM for notification. See Telephone encounter for: 04/27/18.LORazepam (ATIVAN) 2 [id number] [place] [place] - [address] [place] [address] Phone: [phone] Fax: [phone]  Pt called in and states has been out of Ativan for some time, he now however is having panic attacks over this virus and is wanting to know if he can get one refill or partial before coming in.

## 2018-05-25 ENCOUNTER — Other Ambulatory Visit: Payer: Self-pay | Admitting: Physician Assistant

## 2018-05-25 DIAGNOSIS — F430 Acute stress reaction: Principal | ICD-10-CM

## 2018-05-25 DIAGNOSIS — F410 Panic disorder [episodic paroxysmal anxiety] without agoraphobia: Secondary | ICD-10-CM

## 2018-05-26 ENCOUNTER — Other Ambulatory Visit: Payer: Self-pay | Admitting: Physician Assistant

## 2018-05-26 DIAGNOSIS — F410 Panic disorder [episodic paroxysmal anxiety] without agoraphobia: Secondary | ICD-10-CM

## 2018-05-26 DIAGNOSIS — F430 Acute stress reaction: Principal | ICD-10-CM

## 2018-06-08 ENCOUNTER — Other Ambulatory Visit: Payer: Self-pay | Admitting: Physician Assistant

## 2018-06-08 DIAGNOSIS — F410 Panic disorder [episodic paroxysmal anxiety] without agoraphobia: Secondary | ICD-10-CM

## 2018-06-08 DIAGNOSIS — F430 Acute stress reaction: Principal | ICD-10-CM

## 2018-06-09 NOTE — Telephone Encounter (Signed)
Pt called requesting refill on QUEtiapine (SEROQUEL) 25 MG tablet and LORazepam (ATIVAN) 2 MG tablet. Please advise.    [id number] [place], [place] - [address]  [address] [place] [address]  Phone: [phone] Fax: [phone]  Not a 24 hour pharmacy; exact hours not known.

## 2018-06-10 ENCOUNTER — Telehealth: Payer: Self-pay | Admitting: *Deleted

## 2018-06-10 DIAGNOSIS — F430 Acute stress reaction: Principal | ICD-10-CM

## 2018-06-10 DIAGNOSIS — F410 Panic disorder [episodic paroxysmal anxiety] without agoraphobia: Secondary | ICD-10-CM

## 2018-06-10 NOTE — Telephone Encounter (Signed)
Dr. Creta Levin,     Patient is scheduled with you on Tuesday  At 2pm he needs a refill of his Ativan and Seroquel , the pharmacy states he last filled ativan on 04-28-2018.  Hasan states he has a couple left but does not think he can wait until his visit to have it filled because he has been taking it almost everyday due to increased stress and is almost out.    [facility] [address] [place], [place] [address].

## 2018-06-11 ENCOUNTER — Other Ambulatory Visit: Payer: Self-pay | Admitting: *Deleted

## 2018-06-11 DIAGNOSIS — F410 Panic disorder [episodic paroxysmal anxiety] without agoraphobia: Secondary | ICD-10-CM

## 2018-06-11 DIAGNOSIS — F430 Acute stress reaction: Principal | ICD-10-CM

## 2018-06-11 MED ORDER — LORAZEPAM 2 MG PO TABS: 1.0000 mg | ORAL_TABLET | Freq: Every day | ORAL | 1 refills | Status: DC | PRN

## 2018-06-11 MED ORDER — QUETIAPINE FUMARATE 25 MG PO TABS: 50.0000 mg | ORAL_TABLET | Freq: Every day | ORAL | 3 refills | Status: DC

## 2018-06-11 NOTE — Addendum Note (Signed)
Addended by: Collie Siad A on: 06/11/2018 08:58 AM   Modules accepted: Orders

## 2018-06-16 ENCOUNTER — Encounter: Payer: Self-pay | Admitting: Registered Nurse

## 2018-06-16 ENCOUNTER — Telehealth (INDEPENDENT_AMBULATORY_CARE_PROVIDER_SITE_OTHER): Payer: 59 | Admitting: Registered Nurse

## 2018-06-16 ENCOUNTER — Other Ambulatory Visit: Payer: Self-pay

## 2018-06-16 DIAGNOSIS — F410 Panic disorder [episodic paroxysmal anxiety] without agoraphobia: Secondary | ICD-10-CM | POA: Diagnosis not present

## 2018-06-16 DIAGNOSIS — F430 Acute stress reaction: Secondary | ICD-10-CM

## 2018-06-16 DIAGNOSIS — I10 Essential (primary) hypertension: Secondary | ICD-10-CM

## 2018-06-16 DIAGNOSIS — F32A Depression, unspecified: Secondary | ICD-10-CM

## 2018-06-16 DIAGNOSIS — N521 Erectile dysfunction due to diseases classified elsewhere: Secondary | ICD-10-CM | POA: Diagnosis not present

## 2018-06-16 DIAGNOSIS — F419 Anxiety disorder, unspecified: Secondary | ICD-10-CM

## 2018-06-16 DIAGNOSIS — F329 Major depressive disorder, single episode, unspecified: Secondary | ICD-10-CM

## 2018-06-16 MED ORDER — SERTRALINE HCL 50 MG PO TABS: 50.0000 mg | ORAL_TABLET | Freq: Every day | ORAL | 1 refills | Status: DC

## 2018-06-16 MED ORDER — BUSPIRONE HCL 15 MG PO TABS: 15.0000 mg | ORAL_TABLET | Freq: Three times a day (TID) | ORAL | 0 refills | Status: DC

## 2018-06-16 MED ORDER — LORAZEPAM 2 MG PO TABS: 1.0000 mg | ORAL_TABLET | Freq: Every day | ORAL | 1 refills | Status: DC | PRN

## 2018-06-16 MED ORDER — TADALAFIL 20 MG PO TABS: 10.0000 mg | ORAL_TABLET | ORAL | 1 refills | Status: DC | PRN

## 2018-06-16 MED ORDER — NEBIVOLOL HCL 5 MG PO TABS: 5.0000 mg | ORAL_TABLET | Freq: Every day | ORAL | 1 refills | Status: DC

## 2018-06-16 MED ORDER — QUETIAPINE FUMARATE 25 MG PO TABS: 50.0000 mg | ORAL_TABLET | Freq: Every day | ORAL | 3 refills | Status: DC

## 2018-06-16 NOTE — Progress Notes (Signed)
Pt needs the Est. Care with new PCP to manage medications and chronic conditions. Pt states he also needs to discuss medications and possible refills.

## 2018-06-16 NOTE — Progress Notes (Signed)
Telemedicine Encounter- SOAP NOTE Established Patient  This telephone encounter was conducted with the patient's (or proxy's) verbal consent via audio telecommunications: yes  Patient was instructed to have this encounter in a suitably private space; and to only have persons present to whom they give permission to participate. In addition, patient identity was confirmed by use of name plus two identifiers (DOB and address).  I discussed the limitations, risks, security and privacy concerns of performing an evaluation and management service by telephone and the availability of in person appointments. I also discussed with the patient that there may be a patient responsible charge related to this service. The patient expressed understanding and agreed to proceed.  I spent a total of 28 minutes talking with the patient or their proxy.  CC: Establish care, medication check  Subjective   Matthew Hanson is a 43 y.o. established patient.   HTN: Well controlled with BYSTOLIC 5mg  PO daily. Plan to continue  Anxiety and Depression: Has been taking lexapro 10mg  PO intermittently. Has been taking Buspar and Ativan PRNs as needed. Has been taking Seroquel for sleep. All have limited effect. Is interested in changing medications at this time. He states Lexapro gave him trouble achieving and maintaining erections, which had put stress on his relationship. He is interested in taking Cialis to assist with this issue. He is aware that long term benzodiazepene use is inherently risky and is interested in better controlling his "racing thoughts" and "mental tornado". We discussed briefly his reasons for stress, including a high-stress job, an autistic daughter, and a stressful relationship.  Denies HI/SI    Patient Active Problem List   Diagnosis Date Noted  . Panic disorder without agoraphobia 09/12/2015  . Essential hypertension 09/01/2014  . Anxiety state 09/01/2014  . Depression 09/01/2014     Past Medical History:  Diagnosis Date  . Anxiety   . Hypertension     Current Outpatient Medications  Medication Sig Dispense Refill  . busPIRone (BUSPAR) 15 MG tablet Take 1 tablet (15 mg total) by mouth 3 (three) times daily. 90 tablet 0  . LORazepam (ATIVAN) 2 MG tablet Take 0.5 tablets (1 mg total) by mouth daily as needed for anxiety. 30 tablet 1  . nebivolol (BYSTOLIC) 5 MG tablet Take 1 tablet (5 mg total) by mouth daily. 90 tablet 1  . QUEtiapine (SEROQUEL) 25 MG tablet Take 2 tablets (50 mg total) by mouth at bedtime. 60 tablet 3  . sertraline (ZOLOFT) 50 MG tablet Take 1 tablet (50 mg total) by mouth daily. 60 tablet 1  . tadalafil (ADCIRCA/CIALIS) 20 MG tablet Take 0.5-1 tablets (10-20 mg total) by mouth every other day as needed for erectile dysfunction. 30 tablet 1   No current facility-administered medications for this visit.     No Known Allergies  Social History   Socioeconomic History  . Marital status: Divorced    Spouse name: Not on file  . Number of children: 2  . Years of education: Not on file  . Highest education level: Not on file  Occupational History  . Not on file  Social Needs  . Financial resource strain: Not hard at all  . Food insecurity:    Worry: Never true    Inability: Never true  . Transportation needs:    Medical: No    Non-medical: No  Tobacco Use  . Smoking status: Never Smoker  . Smokeless tobacco: Never Used  Substance and Sexual Activity  . Alcohol use: Yes  Alcohol/week: 3.0 standard drinks    Types: 3 Standard drinks or equivalent per week    Comment: weekends  . Drug use: No  . Sexual activity: Yes    Partners: Female    Birth control/protection: None  Lifestyle  . Physical activity:    Days per week: 0 days    Minutes per session: 0 min  . Stress: Rather much  Relationships  . Social connections:    Talks on phone: Twice a week    Gets together: Twice a week    Attends religious service: Never    Active  member of club or organization: No    Attends meetings of clubs or organizations: Never    Relationship status: Divorced  . Intimate partner violence:    Fear of current or ex partner: No    Emotionally abused: No    Physically abused: No    Forced sexual activity: No  Other Topics Concern  . Not on file  Social History Narrative  . Not on file    ROS  Objective   Vitals as reported by the patient: There were no vitals filed for this visit.  Diagnoses and all orders for this visit:  Erectile disorder due to medical condition in male -     tadalafil (ADCIRCA/CIALIS) 20 MG tablet; Take 0.5-1 tablets (10-20 mg total) by mouth every other day as needed for erectile dysfunction.  Panic state as acute reaction to exceptional (gross) stress -     LORazepam (ATIVAN) 2 MG tablet; Take 0.5 tablets (1 mg total) by mouth daily as needed for anxiety. -     QUEtiapine (SEROQUEL) 25 MG tablet; Take 2 tablets (50 mg total) by mouth at bedtime.  Essential hypertension -     nebivolol (BYSTOLIC) 5 MG tablet; Take 1 tablet (5 mg total) by mouth daily.  Severe anxiety -     busPIRone (BUSPAR) 15 MG tablet; Take 1 tablet (15 mg total) by mouth 3 (three) times daily. -     sertraline (ZOLOFT) 50 MG tablet; Take 1 tablet (50 mg total) by mouth daily.  Panic state as acute reaction to exceptional (gross) stress Comments: He has made some good strides but continues to suffer.  I am surprised his panic is pushing through the medication dosages.  Increasing Orders: -     LORazepam (ATIVAN) 2 MG tablet; Take 0.5 tablets (1 mg total) by mouth daily as needed for anxiety. -     QUEtiapine (SEROQUEL) 25 MG tablet; Take 2 tablets (50 mg total) by mouth at bedtime.  Anxiety and depression -     sertraline (ZOLOFT) 50 MG tablet; Take 1 tablet (50 mg total) by mouth daily.  PLAN:  Pt to start on sertraline  PO daily for anxiety and depression. We are optimistic that with consistent therapy, he can  improve his anxiety and depression. He is ok to d/c escitalopram at this time, as he had been taking it inconsistently enough that no taper is needed  Pt to start on tadalifil  PO q48h as needed for ED d/t medication side effect. He states that ED has been a significant stressor on his relationship and is optimistic that this will help improve his relationship.  Refilled other medications including PRN Buspar, PRN Ativan, Bystolic, and Sertraline  We discussed the concerns of being on multiple medications that can lower blood pressure and have CNS depressant effects. We reviewed reasons to be concerned and reasons to call the clinic  Pt  encouraged to contact clinic with any questions, comments, or concerns   I discussed the assessment and treatment plan with the patient. The patient was provided an opportunity to ask questions and all were answered. The patient agreed with the plan and demonstrated an understanding of the instructions.   The patient was advised to call back or seek an in-person evaluation if the symptoms worsen or if the condition fails to improve as anticipated.  I provided 28 minutes of non-face-to-face time during this encounter.  Janeece Agee, NP  Primary Care at [facility]

## 2018-07-07 ENCOUNTER — Other Ambulatory Visit: Payer: Self-pay | Admitting: *Deleted

## 2018-07-07 ENCOUNTER — Other Ambulatory Visit: Payer: Self-pay | Admitting: Registered Nurse

## 2018-07-07 DIAGNOSIS — F419 Anxiety disorder, unspecified: Secondary | ICD-10-CM

## 2018-07-07 MED ORDER — BUSPIRONE HCL 15 MG PO TABS: 15.0000 mg | ORAL_TABLET | Freq: Three times a day (TID) | ORAL | 0 refills | Status: DC

## 2018-07-21 NOTE — Telephone Encounter (Signed)
Devoria Albe on 06/16/2018

## 2018-08-08 ENCOUNTER — Other Ambulatory Visit: Payer: Self-pay | Admitting: Registered Nurse

## 2018-08-08 DIAGNOSIS — F419 Anxiety disorder, unspecified: Secondary | ICD-10-CM

## 2018-12-24 ENCOUNTER — Other Ambulatory Visit: Payer: Self-pay | Admitting: Registered Nurse

## 2018-12-24 DIAGNOSIS — F410 Panic disorder [episodic paroxysmal anxiety] without agoraphobia: Secondary | ICD-10-CM

## 2018-12-24 DIAGNOSIS — F430 Acute stress reaction: Secondary | ICD-10-CM

## 2018-12-24 NOTE — Telephone Encounter (Signed)
Requested medication (s) are due for refill today: yes  Requested medication (s) are on the active medication list: yes  Last refill:  11/21/2018  Future visit scheduled: no  Notes to clinic:  Refill cannot be delegated    Requested Prescriptions  Pending Prescriptions Disp Refills   QUEtiapine (SEROQUEL) 25 MG tablet [Pharmacy Med Name: QUETIAPINE FUMARATE 25 MG TAB] 60 tablet 3    Sig: Take 2 tablets (50 mg total) by mouth at bedtime.     Not Delegated - Psychiatry:  Antipsychotics - Second Generation (Atypical) - quetiapine Failed - 12/24/2018  1:02 AM      Failed - This refill cannot be delegated      Failed - ALT in normal range and within 180 days    ALT  Date Value Ref Range Status  11/09/2015 30 9 - 46 U/L Final         Failed - AST in normal range and within 180 days    AST  Date Value Ref Range Status  11/09/2015 34 10 - 40 U/L Final         Failed - Valid encounter within last 6 months    Recent Outpatient Visits          6 months ago Erectile disorder due to medical condition in male   [facility], Delfino Lovett, NP   11 months ago Sore throat   [facility] [place], MD   1 year ago Acute bacterial sinusitis   [facility] at [facility], Arlie Solomons, MD   1 year ago Localized edema   [facility] at [facility], Fenton Malling, MD   1 year ago Anxiety state   [facility] at [facility], [place], PA-C             Passed - Last BP in normal range    BP Readings from Last 1 Encounters:  01/02/18 128/89         Passed - Completed PHQ-2 or PHQ-9 in the last 360 days.

## 2018-12-28 ENCOUNTER — Telehealth: Payer: Self-pay | Admitting: *Deleted

## 2018-12-28 NOTE — Telephone Encounter (Signed)
30 days sent in.  Please schedule an appointment. 

## 2019-01-15 ENCOUNTER — Other Ambulatory Visit: Payer: Self-pay | Admitting: Registered Nurse

## 2019-01-15 DIAGNOSIS — I10 Essential (primary) hypertension: Secondary | ICD-10-CM

## 2019-01-15 NOTE — Telephone Encounter (Signed)
Requested medication (s) are due for refill today: yes  Requested medication (s) are on the active medication list: yes  Last refill: 10/24/2018  Future visit scheduled: no  Notes to clinic: no valid encounter in last 6 months   Requested Prescriptions  Pending Prescriptions Disp Refills   BYSTOLIC 5 MG tablet [Pharmacy Med Name: BYSTOLIC 5 MG TABLET] 90 tablet 1    Sig: TAKE 1 TABLET BY MOUTH EVERY DAY     Cardiovascular:  Beta Blockers Failed - 01/15/2019  8:32 AM      Failed - Valid encounter within last 6 months    Recent Outpatient Visits          7 months ago Erectile disorder due to medical condition in male   [facility], Delfino Lovett, NP   1 year ago Sore throat   [facility] at [place], MD   1 year ago Acute bacterial sinusitis   [facility] at [facility], Arlie Solomons, MD   1 year ago Localized edema   [facility] at [facility], Fenton Malling, MD   1 year ago Anxiety state   Primary Care at [facility], [place], [place]             Passed - Last BP in normal range    BP Readings from Last 1 Encounters:  01/02/18 128/89         Passed - Last Heart Rate in normal range    Pulse Readings from Last 1 Encounters:  01/02/18 (!) 55

## 2019-01-15 NOTE — Telephone Encounter (Signed)
LVMTCB and schedule ov with labs

## 2019-01-15 NOTE — Telephone Encounter (Signed)
lvmtcb

## 2019-01-15 NOTE — Telephone Encounter (Signed)
Refilled medication for 30 days, 0 refills. Patient needs OV and lab work for additional refills. 

## 2019-01-24 ENCOUNTER — Other Ambulatory Visit: Payer: Self-pay | Admitting: Registered Nurse

## 2019-01-24 DIAGNOSIS — F410 Panic disorder [episodic paroxysmal anxiety] without agoraphobia: Secondary | ICD-10-CM

## 2019-01-24 DIAGNOSIS — F430 Acute stress reaction: Secondary | ICD-10-CM

## 2019-01-26 NOTE — Telephone Encounter (Signed)
Patient is requesting a refill of the following medications: Requested Prescriptions   Pending Prescriptions Disp Refills   QUEtiapine (SEROQUEL) 25 MG tablet [Pharmacy Med Name: QUETIAPINE FUMARATE 25 MG TAB] 180 tablet 1    Sig: TAKE 2 TABLETS (50 MG TOTAL) BY MOUTH AT BEDTIME.    Date of patient request: 01/25/19 Last office visit: 06/16/18 Date of last refill: 12/28/18 Last refill amount: 60 Follow up time period per chart: non listed

## 2019-02-10 ENCOUNTER — Ambulatory Visit: Payer: 59 | Admitting: Adult Health Nurse Practitioner

## 2019-02-10 ENCOUNTER — Other Ambulatory Visit: Payer: Self-pay

## 2019-02-10 VITALS — BP 126/78 | HR 77 | Temp 98.7°F | Ht 74.0 in | Wt 235.8 lb

## 2019-02-10 DIAGNOSIS — Z23 Encounter for immunization: Secondary | ICD-10-CM

## 2019-02-10 DIAGNOSIS — F410 Panic disorder [episodic paroxysmal anxiety] without agoraphobia: Secondary | ICD-10-CM | POA: Diagnosis not present

## 2019-02-10 DIAGNOSIS — F411 Generalized anxiety disorder: Secondary | ICD-10-CM

## 2019-02-10 DIAGNOSIS — F32A Depression, unspecified: Secondary | ICD-10-CM

## 2019-02-10 DIAGNOSIS — N521 Erectile dysfunction due to diseases classified elsewhere: Secondary | ICD-10-CM

## 2019-02-10 DIAGNOSIS — I10 Essential (primary) hypertension: Secondary | ICD-10-CM | POA: Diagnosis not present

## 2019-02-10 DIAGNOSIS — R6889 Other general symptoms and signs: Secondary | ICD-10-CM | POA: Diagnosis not present

## 2019-02-10 DIAGNOSIS — F430 Acute stress reaction: Secondary | ICD-10-CM

## 2019-02-10 DIAGNOSIS — F329 Major depressive disorder, single episode, unspecified: Secondary | ICD-10-CM

## 2019-02-10 MED ORDER — TADALAFIL 20 MG PO TABS: 20.0000 mg | ORAL_TABLET | Freq: Every day | ORAL | 0 refills | Status: DC | PRN

## 2019-02-10 MED ORDER — TADALAFIL 20 MG PO TABS: 10.0000 mg | ORAL_TABLET | ORAL | 1 refills | Status: DC | PRN

## 2019-02-10 MED ORDER — LORAZEPAM 2 MG PO TABS: 1.0000 mg | ORAL_TABLET | Freq: Every day | ORAL | 1 refills | Status: DC | PRN

## 2019-02-10 MED ORDER — PAROXETINE HCL 30 MG PO TABS: 30.0000 mg | ORAL_TABLET | Freq: Every day | ORAL | 1 refills | Status: DC

## 2019-02-10 MED ORDER — NEBIVOLOL HCL 5 MG PO TABS: 5.0000 mg | ORAL_TABLET | Freq: Every day | ORAL | 1 refills | Status: DC

## 2019-02-10 MED ORDER — QUETIAPINE FUMARATE 25 MG PO TABS: 50.0000 mg | ORAL_TABLET | Freq: Every day | ORAL | 1 refills | Status: DC

## 2019-02-10 NOTE — Patient Instructions (Signed)
° ° ° °  If you have lab work done today you will be contacted with your lab results within the next 2 weeks.  If you have not heard from us then please contact us. The fastest way to get your results is to register for My Chart. ° ° °IF you received an x-ray today, you will receive an invoice from [facility]. Please contact [facility] at [phone] with questions or concerns regarding your invoice.  ° °IF you received labwork today, you will receive an invoice from [facility]. Please contact [facility] at [phone] with questions or concerns regarding your invoice.  ° °Our billing staff will not be able to assist you with questions regarding bills from these companies. ° °You will be contacted with the lab results as soon as they are available. The fastest way to get your results is to activate your My Chart account. Instructions are located on the last page of this paperwork. If you have not heard from us regarding the results in 2 weeks, please contact this office. °  ° ° ° °

## 2019-02-10 NOTE — Progress Notes (Signed)
Chief Complaint  Patient presents with  . Medication Refill  . score 28    HPI   Patient presents for f/u.  Has not been seen since 06/2018 by Kathrin Ruddy NP.  Reviewed his chart prior to seeing him.  Patient has a long hx of anxiety/depression and situational stressors including divorce, a child that has special needs, and his job.  He reports that he stopped drinking this summer. He was drinking too much he reports.  Feels better.  Had weaned himself off Sertraline and was in a good place for a few months.  However, he is living with girlfriend and two children.  Recently, girlfriend's daughter, boyfriend, and 46 year old moved in so that there are 7 people in a two bedroom apartment.  He doesn't feel that this is good for his children and he cannot get an escape as he works at home and helps his children with school at home too.  This is countered by the feeling he doesn't want to disrupt their lives. We discussed talking to a therapist for some objective conversation since he seems to be struggling with decision.   Due to this stress, he wonders if he shouldn't go back on an SSRI.  He did not have strong feelings on Sertraline but also felt that his drinking at the time may not have allowed it to fully work effectively.  We discussed Paxil in terms of his anxiety and he is amenable to starting.  He is not sure what the Buspar was for and only took prn so discussed removing it from med list. Still taking Seroquel, Bysystolic, and has not had Ativan since 06/2018.    We discussed Cialis which he never had filled prior due to cost.  I have refilled for him to have on hand.  He notes problems when on SSRIs.   Mostly, he is worried about everything.  Reviewed abnormal labs from last time.  Also, concerned about a mole on his back and white spots in his mouth.   Noticed that for the past 1-2 months, he is getting very cold.  Normally, a warm natured person, rarely wears sweatshirts. He has been sitting  at home in a toboggan.  He is concerned about whether his abnormal blood tests last time here are connected.     Problem List    Problem List: 2017-08: Panic disorder without agoraphobia 2016-07: Essential hypertension 2016-07: Anxiety state 2016-07: Depression   Allergies   has No Known Allergies.  Medications    Current Outpatient Medications:  .  busPIRone (BUSPAR) 15 MG tablet, TAKE 1 TABLET (15 MG TOTAL) BY MOUTH 3 (THREE) TIMES DAILY., Disp: 90 tablet, Rfl: 0 .  BYSTOLIC 5 MG tablet, TAKE 1 TABLET BY MOUTH EVERY DAY, Disp: 30 tablet, Rfl: 0 .  LORazepam (ATIVAN) 2 MG tablet, Take 0.5 tablets (1 mg total) by mouth daily as needed for anxiety., Disp: 30 tablet, Rfl: 1 .  QUEtiapine (SEROQUEL) 25 MG tablet, TAKE 2 TABLETS (50 MG TOTAL) BY MOUTH AT BEDTIME., Disp: 180 tablet, Rfl: 1 .  sertraline (ZOLOFT) 50 MG tablet, Take 1 tablet (50 mg total) by mouth daily., Disp: 60 tablet, Rfl: 1 .  tadalafil (ADCIRCA/CIALIS) 20 MG tablet, Take 0.5-1 tablets (10-20 mg total) by mouth every other day as needed for erectile dysfunction., Disp: 30 tablet, Rfl: 1   Review of Systems    Constitutional: Negative for activity change, appetite change, chills and fever.  HENT: Negative for congestion, nosebleeds, trouble  swallowing and voice change.   Respiratory: Negative for cough, shortness of breath and wheezing.   Gastrointestinal: Negative for diarrhea, nausea and vomiting.  Genitourinary: Negative for difficulty urinating, dysuria, flank pain and hematuria.  Musculoskeletal: Negative for back pain, joint swelling and neck pain.  Neurological: Negative for dizziness, speech difficulty, light-headedness and numbness.  Psychological ROS: positive for - anxiety, depression, irritability and sleep disturbances negative for - hostility, sleep disturbances or suicidal ideation  See HPI. All other review of systems negative.     Physical Exam:   Physical Examination: General appearance -  alert, well appearing, and in no distress and oriented to person, place, and time Mental status - normal mood, behavior, speech, dress, motor activity, and thought processes Mouth:  Some thinning and whitening of lower gum line below 2 bottom front teeth.  No tonsillar hypertrophy or other abnormal changes.  Neck - supple, no significant adenopathy, carotids upstroke normal bilaterally, no bruits, thyroid exam: thyroid is normal in size without nodules or tenderness Chest - clear to auscultation, no wheezes, rales or rhonchi, symmetric air entry  Heart - normal rate, regular rhythm, normal S1, S2, no murmurs, rubs, clicks or gallops Extremities - dependent LE edema without clubbing or cyanosis Skin - normal coloration and turgor, no rashes, no suspicious skin lesions noted, small skin tag to right lateral lower back without evidence of abnormality.                         No hyperpigmentation of skin.  No current hematomas noted   Lab Review   labs reviewed, I note that lipids were abnormal and renal function in 2017.   Assessment & Plan:  Ceylon Arenson is a 43 y.o. male . 1. Need for prophylactic vaccination and inoculation against influenza    1. Need for prophylactic vaccination and inoculation against influenza   2. Essential hypertension   3. Sensitivity to the cold   4. Panic state as acute reaction to exceptional (gross) stress   5. Panic state as acute reaction to exceptional (gross) stress   6. Erectile disorder due to medical condition in male   7. Anxiety state   8. Depression, unspecified depression type    Orders Placed This Encounter  Procedures  . Flu vaccine  . Thyroid Panel With TSH  . Comprehensive metabolic panel  . CBC with Differential  . POCT urinalysis dipstick   Meds ordered this encounter  Medications  . DISCONTD: tadalafil (CIALIS) 20 MG tablet    Sig: Take 1 tablet (20 mg total) by mouth daily as needed for erectile dysfunction.    Dispense:  10  tablet    Refill:  0  . nebivolol (BYSTOLIC) 5 MG tablet    Sig: Take 1 tablet (5 mg total) by mouth daily.    Dispense:  90 tablet    Refill:  1  . LORazepam (ATIVAN) 2 MG tablet    Sig: Take 0.5 tablets (1 mg total) by mouth daily as needed for anxiety.    Dispense:  30 tablet    Refill:  1  . QUEtiapine (SEROQUEL) 25 MG tablet    Sig: Take 2 tablets (50 mg total) by mouth at bedtime.    Dispense:  180 tablet    Refill:  1  . PARoxetine (PAXIL) 30 MG tablet    Sig: Take 1 tablet (30 mg total) by mouth daily.    Dispense:  30 tablet    Refill:  1  . tadalafil (CIALIS) 20 MG tablet    Sig: Take 0.5-1 tablets (10-20 mg total) by mouth every other day as needed for erectile dysfunction.    Dispense:  30 tablet    Refill:  1   Will f/u in 4-6 weeks for evaluation of new SSRI.  Patient was encouraged with current stressors.  Offerred therapist referral and he will consider.  Congratulated on drinking.  He will f/u when able for fasting lipids.  He is inline with this plan.   A total of 40 minutes were spent face-to-face with the patient during this encounter and over half of that time was spent on counseling and coordination of care.    Orders Placed This Encounter  Procedures  . Flu vaccine     Elyse JarvisSarah A Aison Malveaux, NP

## 2019-02-11 LAB — CBC WITH DIFFERENTIAL/PLATELET
Basophils Absolute: 0 10*3/uL (ref 0.0–0.2)
Basos: 0 %
EOS (ABSOLUTE): 0.1 10*3/uL (ref 0.0–0.4)
Eos: 0 %
Hematocrit: 48.7 % (ref 37.5–51.0)
Hemoglobin: 16.7 g/dL (ref 13.0–17.7)
Immature Grans (Abs): 0 10*3/uL (ref 0.0–0.1)
Immature Granulocytes: 0 %
Lymphocytes Absolute: 1.6 10*3/uL (ref 0.7–3.1)
Lymphs: 13 %
MCH: 30.6 pg (ref 26.6–33.0)
MCHC: 34.3 g/dL (ref 31.5–35.7)
MCV: 89 fL (ref 79–97)
Monocytes Absolute: 0.5 10*3/uL (ref 0.1–0.9)
Monocytes: 4 %
Neutrophils Absolute: 9.7 10*3/uL — ABNORMAL HIGH (ref 1.4–7.0)
Neutrophils: 83 %
Platelets: 335 10*3/uL (ref 150–450)
RBC: 5.46 x10E6/uL (ref 4.14–5.80)
RDW: 12.5 % (ref 11.6–15.4)
WBC: 11.9 10*3/uL — ABNORMAL HIGH (ref 3.4–10.8)

## 2019-02-11 LAB — COMPREHENSIVE METABOLIC PANEL
ALT: 32 IU/L (ref 0–44)
AST: 30 IU/L (ref 0–40)
Albumin/Globulin Ratio: 2.1 (ref 1.2–2.2)
Albumin: 4.7 g/dL (ref 4.0–5.0)
Alkaline Phosphatase: 109 IU/L (ref 39–117)
BUN/Creatinine Ratio: 10 (ref 9–20)
BUN: 12 mg/dL (ref 6–24)
Bilirubin Total: 0.4 mg/dL (ref 0.0–1.2)
CO2: 25 mmol/L (ref 20–29)
Calcium: 9.7 mg/dL (ref 8.7–10.2)
Chloride: 98 mmol/L (ref 96–106)
Creatinine, Ser: 1.16 mg/dL (ref 0.76–1.27)
GFR calc Af Amer: 89 mL/min/{1.73_m2} (ref >59)
GFR calc non Af Amer: 77 mL/min/{1.73_m2} (ref >59)
Globulin, Total: 2.2 g/dL (ref 1.5–4.5)
Glucose: 94 mg/dL (ref 65–99)
Potassium: 4.6 mmol/L (ref 3.5–5.2)
Sodium: 137 mmol/L (ref 134–144)
Total Protein: 6.9 g/dL (ref 6.0–8.5)

## 2019-02-11 LAB — THYROID PANEL WITH TSH
Free Thyroxine Index: 1.7 (ref 1.2–4.9)
T3 Uptake Ratio: 28 % (ref 24–39)
T4, Total: 6.1 ug/dL (ref 4.5–12.0)
TSH: 1.14 u[IU]/mL (ref 0.450–4.500)

## 2019-02-24 NOTE — Telephone Encounter (Signed)
Pt scheduled to see Colette Ribas on 1/20

## 2019-03-03 ENCOUNTER — Other Ambulatory Visit: Payer: Self-pay

## 2019-03-03 ENCOUNTER — Telehealth (INDEPENDENT_AMBULATORY_CARE_PROVIDER_SITE_OTHER): Payer: 59 | Admitting: Adult Health Nurse Practitioner

## 2019-03-03 DIAGNOSIS — I10 Essential (primary) hypertension: Secondary | ICD-10-CM | POA: Diagnosis not present

## 2019-03-03 DIAGNOSIS — F32A Depression, unspecified: Secondary | ICD-10-CM

## 2019-03-03 DIAGNOSIS — F329 Major depressive disorder, single episode, unspecified: Secondary | ICD-10-CM | POA: Diagnosis not present

## 2019-03-03 DIAGNOSIS — F419 Anxiety disorder, unspecified: Secondary | ICD-10-CM

## 2019-03-03 DIAGNOSIS — D72819 Decreased white blood cell count, unspecified: Secondary | ICD-10-CM | POA: Diagnosis not present

## 2019-03-03 DIAGNOSIS — Z23 Encounter for immunization: Secondary | ICD-10-CM | POA: Diagnosis not present

## 2019-03-03 NOTE — Patient Instructions (Signed)
° ° ° °  If you have lab work done today you will be contacted with your lab results within the next 2 weeks.  If you have not heard from us then please contact us. The fastest way to get your results is to register for My Chart. ° ° °IF you received an x-ray today, you will receive an invoice from [facility]. Please contact [facility] at [phone] with questions or concerns regarding your invoice.  ° °IF you received labwork today, you will receive an invoice from [facility]. Please contact [facility] at [phone] with questions or concerns regarding your invoice.  ° °Our billing staff will not be able to assist you with questions regarding bills from these companies. ° °You will be contacted with the lab results as soon as they are available. The fastest way to get your results is to activate your My Chart account. Instructions are located on the last page of this paperwork. If you have not heard from us regarding the results in 2 weeks, please contact this office. °  ° ° ° °

## 2019-03-04 NOTE — Progress Notes (Signed)
Telemedicine Encounter- SOAP NOTE Established Patient  This telephone encounter was conducted with the patient's (or proxy's) verbal consent via audio telecommunications: yes/no: Yes Patient was instructed to have this encounter in a suitably private space; and to only have persons present to whom they give permission to participate. In addition, patient identity was confirmed by use of name plus two identifiers (DOB and address).  I discussed the limitations, risks, security and privacy concerns of performing an evaluation and management service by telephone and the availability of in person appointments. I also discussed with the patient that there may be a patient responsible charge related to this service. The patient expressed understanding and agreed to proceed.  I spent a total of TIME; 0 MIN TO 60 MIN: 20 minutes talking with the patient or their proxy.  Chief Complaint  Patient presents with  . Follow-up    Medical conditions    Subjective   Matthew Hanson is a 44 y.o. established patient. Telephone visit today for f/u of medication change, etc.   HPI  Patient reports he is still in the same situation living with his kids and girlfriend and her adult children.  Has not made a decision regarding moving etc.  However, he does feel that the Paxil has helped his anxiety and allowed him to not go down excessively ruminative thoughts.  He can think a little clearer.  Seroquel helps with sleep and he is stable on all other meds.  Work is excessively busy for him right now.  He is considering talking to someone regarding his current situation and what the right move is regarding his children.   Patient Active Problem List   Diagnosis Date Noted  . Panic disorder without agoraphobia 09/12/2015  . Essential hypertension 09/01/2014  . Anxiety state 09/01/2014  . Depression 09/01/2014    Past Medical History:  Diagnosis Date  . Anxiety   . Hypertension     Current Outpatient  Medications  Medication Sig Dispense Refill  . LORazepam (ATIVAN) 2 MG tablet Take 0.5 tablets (1 mg total) by mouth daily as needed for anxiety. 30 tablet 1  . nebivolol (BYSTOLIC) 5 MG tablet Take 1 tablet (5 mg total) by mouth daily. 90 tablet 1  . PARoxetine (PAXIL) 30 MG tablet Take 1 tablet (30 mg total) by mouth daily. 30 tablet 1  . QUEtiapine (SEROQUEL) 25 MG tablet Take 2 tablets (50 mg total) by mouth at bedtime. 180 tablet 1  . tadalafil (CIALIS) 20 MG tablet Take 0.5-1 tablets (10-20 mg total) by mouth every other day as needed for erectile dysfunction. 30 tablet 1   No current facility-administered medications for this visit.    No Known Allergies  Social History   Socioeconomic History  . Marital status: Divorced    Spouse name: Not on file  . Number of children: 2  . Years of education: Not on file  . Highest education level: Not on file  Occupational History  . Not on file  Tobacco Use  . Smoking status: Never Smoker  . Smokeless tobacco: Never Used  Substance and Sexual Activity  . Alcohol use: Yes    Alcohol/week: 3.0 standard drinks    Types: 3 Standard drinks or equivalent per week    Comment: weekends  . Drug use: No  . Sexual activity: Yes    Partners: Female    Birth control/protection: None  Other Topics Concern  . Not on file  Social History Narrative  . Not on  file   Social Determinants of Health   Financial Resource Strain: Low Risk   . Difficulty of Paying Living Expenses: Not hard at all  Food Insecurity: No Food Insecurity  . Worried About Charity fundraiser in the Last Year: Never true  . Ran Out of Food in the Last Year: Never true  Transportation Needs: No Transportation Needs  . Lack of Transportation (Medical): No  . Lack of Transportation (Non-Medical): No  Physical Activity: Inactive  . Days of Exercise per Week: 0 days  . Minutes of Exercise per Session: 0 min  Stress: Stress Concern Present  . Feeling of Stress : Rather  much  Social Connections: Moderately Isolated  . Frequency of Communication with Friends and Family: Twice a week  . Frequency of Social Gatherings with Friends and Family: Twice a week  . Attends Religious Services: Never  . Active Member of Clubs or Organizations: No  . Attends Archivist Meetings: Never  . Marital Status: Divorced  Human resources officer Violence: Not At Risk  . Fear of Current or Ex-Partner: No  . Emotionally Abused: No  . Physically Abused: No  . Sexually Abused: No    ROS  Review of Systems  Constitutional: Negative for activity change, appetite change, chills and fever.  HENT: Negative for congestion, nosebleeds, trouble swallowing and voice change.   Respiratory: Negative for cough, shortness of breath and wheezing.   Gastrointestinal: Negative for diarrhea, nausea and vomiting.  Genitourinary: Negative for difficulty urinating, dysuria, flank pain and hematuria.  Musculoskeletal: Negative for back pain, joint swelling and neck pain.  Neurological: Negative for dizziness, speech difficulty, light-headedness and numbness.  See HPI. All other review of systems negative.    Objective    General appearance: oriented to person, place, and time. Mental Status: normal mood, behavior, speech, dress, motor activity, and thought processes.  1. Essential hypertension   2. Anxiety and depression     1. Essential hypertension   2. Anxiety and depression   3. Leukopenia, unspecified type     Regarding his labs, the patient and I reviewed them.  Low WBC count.  Regarding this level, would repeat first to see if it is persistent.  In addition, he will return for a nurse visit and get a fasting lipid panel.    All questions were answered to his satisfaction.   A total of 20 minutes were spent face-to-face with the patient during this encounter and over half of that time was spent on counseling and coordination of care.   I discussed the assessment and  treatment plan with the patient. The patient was provided an opportunity to ask questions and all were answered. The patient agreed with the plan and demonstrated an understanding of the instructions.   The patient was advised to call back or seek an in-person evaluation if the symptoms worsen or if the condition fails to improve as anticipated.  I provided 20 minutes of non-face-to-face time during this encounter.  Glyn Ade, NP  Primary Care at [facility]

## 2019-03-11 ENCOUNTER — Encounter: Payer: Self-pay | Admitting: Adult Health Nurse Practitioner

## 2019-03-11 ENCOUNTER — Other Ambulatory Visit: Payer: Self-pay

## 2019-03-11 ENCOUNTER — Ambulatory Visit (INDEPENDENT_AMBULATORY_CARE_PROVIDER_SITE_OTHER): Payer: 59 | Admitting: Adult Health Nurse Practitioner

## 2019-03-11 VITALS — BP 140/80 | HR 64 | Temp 98.2°F | Ht 74.0 in | Wt 239.8 lb

## 2019-03-11 DIAGNOSIS — H01014 Ulcerative blepharitis left upper eyelid: Secondary | ICD-10-CM | POA: Insufficient documentation

## 2019-03-11 MED ORDER — DOXYCYCLINE HYCLATE 100 MG PO TABS: 100.0000 mg | ORAL_TABLET | Freq: Two times a day (BID) | ORAL | 0 refills | Status: DC

## 2019-03-11 MED ORDER — BACITRA-NEOMYCIN-POLYMYXIN-HC 1 % OP OINT: 1.0000 "application " | TOPICAL_OINTMENT | Freq: Two times a day (BID) | OPHTHALMIC | 0 refills | Status: DC

## 2019-03-11 NOTE — Patient Instructions (Signed)
° ° ° °  If you have lab work done today you will be contacted with your lab results within the next 2 weeks.  If you have not heard from us then please contact us. The fastest way to get your results is to register for My Chart. ° ° °IF you received an x-ray today, you will receive an invoice from [facility]. Please contact [facility] at [phone] with questions or concerns regarding your invoice.  ° °IF you received labwork today, you will receive an invoice from [facility]. Please contact [facility] at [phone] with questions or concerns regarding your invoice.  ° °Our billing staff will not be able to assist you with questions regarding bills from these companies. ° °You will be contacted with the lab results as soon as they are available. The fastest way to get your results is to activate your My Chart account. Instructions are located on the last page of this paperwork. If you have not heard from us regarding the results in 2 weeks, please contact this office. °  ° ° ° °

## 2019-03-11 NOTE — Progress Notes (Signed)
.p      History   Chief Complaint  Patient presents with  . Belepharitis    x1 week Rt eye along with some pressure     HPI   Patient presents with 3+ days of eyelid swelling, redness, and pain.  He was seen at an Urgent Care, diagnosed with blepheritis and given Tobramycin ophthalmic drops.   He woke up this morning with worsening of the swelling, redness, and pain in his left eyelid.  No drainage yet.  He has not used a lid wash and did a warm compress only 3x yesterday..   Explained that it was necessary to really utilize this mgmt of warm compresses and lid washes due to the bacteria/pus in the eyelid.  I have recommended at least 5x a day.   Denies any visual field defect, change in vision, double or blurry vision.  Area on superior eyelid very painful   Past Medical History:  Diagnosis Date  . Anxiety   . Hypertension    No past surgical history on file. Family History  Problem Relation Age of Onset  . Diabetes Father   . Heart disease Father   . Hyperlipidemia Father   . Prostate cancer Father    Social History   Tobacco Use  . Smoking status: Never Smoker  . Smokeless tobacco: Never Used  Substance Use Topics  . Alcohol use: Yes    Alcohol/week: 3.0 standard drinks    Types: 3 Standard drinks or equivalent per week    Comment: weekends  . Drug use: No    Review of Systems  Constitutional: Negative for chills and fever.  Eyes: Positive for photophobia, pain and redness. Negative for visual disturbance.  Neurological: Negative for dizziness and light-headedness.    Allergies   Patient has no known allergies.  Home Medications    Current Outpatient Medications:  .  LORazepam (ATIVAN) 2 MG tablet, Take 0.5 tablets (1 mg total) by mouth daily as needed for anxiety., Disp: 30 tablet, Rfl: 1 .  nebivolol (BYSTOLIC) 5 MG tablet, Take 1 tablet (5 mg total) by mouth daily., Disp: 90 tablet, Rfl: 1 .  PARoxetine (PAXIL) 30 MG tablet, Take 1 tablet (30 mg total)  by mouth daily., Disp: 30 tablet, Rfl: 1 .  QUEtiapine (SEROQUEL) 25 MG tablet, Take 2 tablets (50 mg total) by mouth at bedtime., Disp: 180 tablet, Rfl: 1 .  tadalafil (CIALIS) 20 MG tablet, Take 0.5-1 tablets (10-20 mg total) by mouth every other day as needed for erectile dysfunction., Disp: 30 tablet, Rfl: 1 .  bacitracin-neomycin-polymyxin-hydrocortisone (CORTISPORIN) 1 % ophthalmic ointment, Place 1 application into the left eye 2 (two) times daily., Disp: 3.5 g, Rfl: 0 .  doxycycline (VIBRA-TABS) 100 MG tablet, Take 1 tablet (100 mg total) by mouth 2 (two) times daily., Disp: 20 tablet, Rfl: 0  Meds Ordered and Administered this Visit   Meds ordered this encounter  Medications  . doxycycline (VIBRA-TABS) 100 MG tablet    Sig: Take 1 tablet (100 mg total) by mouth 2 (two) times daily.    Dispense:  20 tablet    Refill:  0  . bacitracin-neomycin-polymyxin-hydrocortisone (CORTISPORIN) 1 % ophthalmic ointment    Sig: Place 1 application into the left eye 2 (two) times daily.    Dispense:  3.5 g    Refill:  0    BP 140/80 (BP Location: Left Arm, Patient Position: Sitting, Cuff Size: Normal)   Pulse 64   Temp 98.2 F (36.8 C)  Ht 6\' 2"  (1.88 m)   Wt 239 lb 12.8 oz (108.8 kg)   SpO2 96%   BMI 30.79 kg/m   Physical Exam Constitutional:      Appearance: He is ill-appearing.  Eyes:     General: Vision grossly intact. Gaze aligned appropriately. No visual field deficit.       Left eye: Hordeolum present.    Extraocular Movements: Extraocular movements intact.     Right eye: No nystagmus.     Left eye: No nystagmus.     Conjunctiva/sclera:     Left eye: Left conjunctiva is injected. No exudate or hemorrhage.    Pupils: Pupils are equal, round, and reactive to light.     Slit lamp exam:    Right eye: Anterior chamber quiet.     Left eye: Anterior chamber quiet.     Visual Fields: Right eye visual fields normal and left eye visual fields normal.   Neurological:     General:  No focal deficit present.     Mental Status: He is alert and oriented to person, place, and time.     Cranial Nerves: Cranial nerves are intact.     Sensory: Sensation is intact.      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  He may continue to use the Tobramycin and I have added a short supply of an ophthalmic steroid to treat the inflammation.  Pt. Was cautioned on using the steroid beyond 5 days due to the possibility of increased IOP. He verbalized understanding.   If area worsens or extends locally, he is to go directly to ER.  He is inline with this plan.    Meds ordered this encounter  Medications  . doxycycline (VIBRA-TABS) 100 MG tablet    Sig: Take 1 tablet (100 mg total) by mouth 2 (two) times daily.    Dispense:  20 tablet    Refill:  0  . bacitracin-neomycin-polymyxin-hydrocortisone (CORTISPORIN) 1 % ophthalmic ointment    Sig: Place 1 application into the left eye 2 (two) times daily.    Dispense:  3.5 g    Refill:  0   A total of 23 minutes were spent face-to-face with the patient during this encounter and over half of that time was spent on counseling and coordination of care.  Glyn Ade, NP

## 2019-05-07 ENCOUNTER — Telehealth: Payer: Self-pay | Admitting: Registered Nurse

## 2019-05-07 ENCOUNTER — Telehealth: Payer: Self-pay

## 2019-05-07 ENCOUNTER — Other Ambulatory Visit: Payer: Self-pay | Admitting: Registered Nurse

## 2019-05-07 ENCOUNTER — Other Ambulatory Visit: Payer: Self-pay

## 2019-05-07 MED ORDER — PAROXETINE HCL 30 MG PO TABS: 30.0000 mg | ORAL_TABLET | Freq: Every day | ORAL | 1 refills | Status: DC

## 2019-05-07 NOTE — Telephone Encounter (Signed)
We can fill 90 days with 1 refill  Thank you  Rich Saxon Barich, np

## 2019-05-07 NOTE — Telephone Encounter (Signed)
Copied from CRM [id number]. Topic: Quick Communication - Rx Refill/Question >> May 07, 2019  1:49 PM Jaquita Rector A wrote: Medication: PARoxetine (PAXIL) 30 MG tablet Patient been out of medication for three days starting to have withdrawal symptoms  Has the patient contacted their pharmacy? Yes.   (Agent: If no, request that the patient contact the pharmacy for the refill.) (Agent: If yes, when and what did the pharmacy advise?)  Preferred Pharmacy (with phone number or street name): [id number] [place], [place] - [address]  Phone:  [phone] Fax:  [phone]     Agent: Please be advised that RX refills may take up to 3 business days. We ask that you follow-up with your pharmacy.

## 2019-05-07 NOTE — Telephone Encounter (Signed)
What is the name of the medication? paxil  Have you contacted your pharmacy to request a refill? yes  Which [facility] [address]    Patient out of prescription. He has been communicating with [facility] all day and agent transferred patient to [place]

## 2019-05-07 NOTE — Telephone Encounter (Signed)
Message sent to provider 

## 2019-05-07 NOTE — Telephone Encounter (Signed)
Requested medication (s) are due for refill today: yes  Requested medication (s) are on the active medication list:   Last refill:  02/10/2019  Future visit scheduled: no  Notes to clinic: script expired    Requested Prescriptions  Pending Prescriptions Disp Refills   PARoxetine (PAXIL) 30 MG tablet 30 tablet 1    Sig: Take 1 tablet (30 mg total) by mouth daily.      Psychiatry:  Antidepressants - SSRI Passed - 05/07/2019  1:56 PM      Passed - Completed PHQ-2 or PHQ-9 in the last 360 days.      Passed - Valid encounter within last 6 months    Recent Outpatient Visits           1 month ago Ulcerative blepharitis of left upper eyelid   Primary Care at [facility], Lonna Cobb, NP   2 months ago Essential hypertension   Primary Care at [facility], Lonna Cobb, NP   2 months ago Need for prophylactic vaccination and inoculation against influenza   Primary Care at [facility], Lonna Cobb, NP   10 months ago Erectile disorder due to medical condition in male   Primary Care at [facility], Gerlene Burdock, NP   1 year ago Sore throat   Primary Care at [facility], Manus Rudd, MD

## 2019-05-07 NOTE — Telephone Encounter (Signed)
Patient is requesting a refill of the following medications: Paxil Requested Prescriptions    No prescriptions requested or ordered in this encounter    Date of patient request: 05/07/2019 Last office visit: 03/11/2019 Date of last refill: 02/10/2019 Last refill amount: 30 tablets Follow up time period per chart: Unspecified

## 2019-09-17 ENCOUNTER — Other Ambulatory Visit: Payer: Self-pay | Admitting: Registered Nurse

## 2019-09-17 DIAGNOSIS — F430 Acute stress reaction: Secondary | ICD-10-CM

## 2019-09-17 DIAGNOSIS — F410 Panic disorder [episodic paroxysmal anxiety] without agoraphobia: Secondary | ICD-10-CM

## 2019-09-17 MED ORDER — QUETIAPINE FUMARATE 25 MG PO TABS: 50.0000 mg | ORAL_TABLET | Freq: Every day | ORAL | 1 refills | Status: DC

## 2019-09-17 MED ORDER — LORAZEPAM 2 MG PO TABS: 1.0000 mg | ORAL_TABLET | Freq: Every day | ORAL | 1 refills | Status: DC | PRN

## 2019-09-17 NOTE — Telephone Encounter (Signed)
Please schedule f/u appt anxiety and depression.

## 2019-09-17 NOTE — Telephone Encounter (Addendum)
Patient requesting LORazepam (ATIVAN) 2 MG tablet and QUEtiapine (SEROQUEL) 25 MG tablet. Patient states he will run out of QUEtiapine (SEROQUEL) 25 MG tablet over the weekend and stated your not suppose to just stop this medication. Informed patient in the future please allow 48 to 72 hour turn around time. Patient would like a follow up regarding request being expedited  Please note new pharmacy mentioned below:  *[facility] -  [address], [place], [place] [address] [phone]

## 2019-09-17 NOTE — Telephone Encounter (Signed)
Patient was last seeb by you on 06/16/18 and last seen for [facility] on 03/03/19 for this same issue. I did send a msg to the scheduling pool to get this patient scheduled for a f/u. Meantime is it ok for you to refill these meds.

## 2019-09-17 NOTE — Telephone Encounter (Signed)
Requested medication (s) are due for refill today -yes  Requested medication (s) are on the active medication list -yes  Future visit scheduled -no  Last refill: 02/10/20  Notes to clinic: Request for non delegated Rx  Requested Prescriptions  Pending Prescriptions Disp Refills   LORazepam (ATIVAN) 2 MG tablet 30 tablet 1    Sig: Take 0.5 tablets (1 mg total) by mouth daily as needed for anxiety.      Not Delegated - Psychiatry:  Anxiolytics/Hypnotics Failed - 09/17/2019  3:37 PM      Failed - This refill cannot be delegated      Failed - Urine Drug Screen completed in last 360 days.      Failed - Valid encounter within last 6 months    Recent Outpatient Visits           6 months ago Ulcerative blepharitis of left upper eyelid   Primary Care at [facility], Lonna Cobb, NP   6 months ago Essential hypertension   Primary Care at [facility], Lonna Cobb, NP   7 months ago Need for prophylactic vaccination and inoculation against influenza   Primary Care at [facility], Lonna Cobb, NP   1 year ago Erectile disorder due to medical condition in male   Primary Care at [facility], Richard, NP   1 year ago Sore throat   Primary Care at [facility], Zoe A, MD                QUEtiapine (SEROQUEL) 25 MG tablet 180 tablet 1    Sig: Take 2 tablets (50 mg total) by mouth at bedtime.      Not Delegated - Psychiatry:  Antipsychotics - Second Generation (Atypical) - quetiapine Failed - 09/17/2019  3:37 PM      Failed - This refill cannot be delegated      Failed - ALT in normal range and within 180 days    ALT  Date Value Ref Range Status  02/10/2019 32 0 - 44 IU/L Final          Failed - AST in normal range and within 180 days    AST  Date Value Ref Range Status  02/10/2019 30 0 - 40 IU/L Final          Failed - Last BP in normal range    BP Readings from Last 1 Encounters:  03/11/19 140/80          Failed - Valid encounter within last 6 months     Recent Outpatient Visits           6 months ago Ulcerative blepharitis of left upper eyelid   Primary Care at [facility], Lonna Cobb, NP   6 months ago Essential hypertension   Primary Care at [facility], Lonna Cobb, NP   7 months ago Need for prophylactic vaccination and inoculation against influenza   Primary Care at [facility], Lonna Cobb, NP   1 year ago Erectile disorder due to medical condition in male   Primary Care at [facility], Gerlene Burdock, NP   1 year ago Sore throat   Primary Care at [facility], Manus Rudd, MD              Passed - Completed PHQ-2 or PHQ-9 in the last 360 days.          Requested Prescriptions  Pending Prescriptions Disp Refills   LORazepam (ATIVAN) 2 MG tablet 30 tablet 1    Sig:  Take 0.5 tablets (1 mg total) by mouth daily as needed for anxiety.      Not Delegated - Psychiatry:  Anxiolytics/Hypnotics Failed - 09/17/2019  3:37 PM      Failed - This refill cannot be delegated      Failed - Urine Drug Screen completed in last 360 days.      Failed - Valid encounter within last 6 months    Recent Outpatient Visits           6 months ago Ulcerative blepharitis of left upper eyelid   Primary Care at [facility], Lonna Cobb, NP   6 months ago Essential hypertension   Primary Care at [facility], Lonna Cobb, NP   7 months ago Need for prophylactic vaccination and inoculation against influenza   Primary Care at [facility], Lonna Cobb, NP   1 year ago Erectile disorder due to medical condition in male   Primary Care at [facility], Richard, NP   1 year ago Sore throat   Primary Care at [facility], Zoe A, MD                QUEtiapine (SEROQUEL) 25 MG tablet 180 tablet 1    Sig: Take 2 tablets (50 mg total) by mouth at bedtime.      Not Delegated - Psychiatry:  Antipsychotics - Second Generation (Atypical) - quetiapine Failed - 09/17/2019  3:37 PM      Failed - This refill cannot be delegated      Failed -  ALT in normal range and within 180 days    ALT  Date Value Ref Range Status  02/10/2019 32 0 - 44 IU/L Final          Failed - AST in normal range and within 180 days    AST  Date Value Ref Range Status  02/10/2019 30 0 - 40 IU/L Final          Failed - Last BP in normal range    BP Readings from Last 1 Encounters:  03/11/19 140/80          Failed - Valid encounter within last 6 months    Recent Outpatient Visits           6 months ago Ulcerative blepharitis of left upper eyelid   Primary Care at [facility], Lonna Cobb, NP   6 months ago Essential hypertension   Primary Care at [facility], Lonna Cobb, NP   7 months ago Need for prophylactic vaccination and inoculation against influenza   Primary Care at [facility], Lonna Cobb, NP   1 year ago Erectile disorder due to medical condition in male   Primary Care at [facility], Gerlene Burdock, NP   1 year ago Sore throat   Primary Care at [facility], Manus Rudd, MD              Passed - Completed PHQ-2 or PHQ-9 in the last 360 days.

## 2019-09-20 NOTE — Telephone Encounter (Signed)
Called pt and scheduled him for Tue. Sept 9 @ 930 am for a f/u appt. Pt stated understanding.   Pt has already received a curtsy refill.

## 2019-09-20 NOTE — Telephone Encounter (Signed)
09/20/2019 - PATIENT REQUESTING REFILLS ON HIS LORAZEPAM 2 mg AND QUETIAPINE 25 mg. HE NEEDS AN OFFICE VISIT WITH RICH MORROW TO RECHECK HIS ANXIETY AND DEPRESSION. I TRIED TO CALL AND SCHEDULE BUT HAD TO LEAVE A MESSAGE ON HIS VOICE MAIL. I WILL ROUTE BACK TO THE CLINICAL TEAM FOR RICH MORROW TO REVIEW BECAUSE IT HAS BEEN SENT TO RICH FROM FELICIA KIRBY. MBC

## 2019-09-30 ENCOUNTER — Other Ambulatory Visit: Payer: Self-pay | Admitting: Registered Nurse

## 2019-09-30 DIAGNOSIS — I10 Essential (primary) hypertension: Secondary | ICD-10-CM

## 2019-09-30 MED ORDER — NEBIVOLOL HCL 5 MG PO TABS: 5.0000 mg | ORAL_TABLET | Freq: Every day | ORAL | 0 refills | Status: DC

## 2019-09-30 NOTE — Telephone Encounter (Signed)
Medication Refill - Medication: nebivolol (BYSTOLIC) 5 MG tablet    Preferred Pharmacy (with phone number or street name):  [id number] [place], [place] - [address] AT [facility] Phone:  [phone]  Fax:  [phone]       Agent: Please be advised that RX refills may take up to 3 business days. We ask that you follow-up with your pharmacy.

## 2019-10-19 ENCOUNTER — Ambulatory Visit: Payer: Self-pay | Admitting: Registered Nurse

## 2019-10-20 ENCOUNTER — Encounter: Payer: Self-pay | Admitting: Registered Nurse

## 2019-10-23 ENCOUNTER — Other Ambulatory Visit: Payer: Self-pay | Admitting: Registered Nurse

## 2019-10-23 DIAGNOSIS — I10 Essential (primary) hypertension: Secondary | ICD-10-CM

## 2019-10-25 NOTE — Telephone Encounter (Signed)
C[facility] refill sent in for pt's BYSTOLIC.

## 2019-11-23 ENCOUNTER — Other Ambulatory Visit: Payer: Self-pay | Admitting: Registered Nurse

## 2019-11-23 DIAGNOSIS — I10 Essential (primary) hypertension: Secondary | ICD-10-CM

## 2019-11-23 NOTE — Telephone Encounter (Signed)
Requested medication (s) are due for refill today: yes  Requested medication (s) are on the active medication list: yes  Last refill:  10/25/19 30  Future visit scheduled: no  Notes to clinic:  needs appt   Requested Prescriptions  Pending Prescriptions Disp Refills   BYSTOLIC 5 MG tablet [Pharmacy Med Name: BYSTOLIC 5 MG TABLET] 30 tablet 0    Sig: TAKE 1 TABLET BY MOUTH EVERY DAY      Cardiovascular:  Beta Blockers Failed - 11/23/2019 11:31 AM      Failed - Last BP in normal range    BP Readings from Last 1 Encounters:  03/11/19 140/80          Failed - Valid encounter within last 6 months    Recent Outpatient Visits           8 months ago Ulcerative blepharitis of left upper eyelid   Primary Care at [facility], Lonna Cobb, NP   8 months ago Essential hypertension   Primary Care at [facility], Lonna Cobb, NP   9 months ago Need for prophylactic vaccination and inoculation against influenza   Primary Care at [facility], Lonna Cobb, NP   1 year ago Erectile disorder due to medical condition in male   Primary Care at [facility], Gerlene Burdock, NP   1 year ago Sore throat   Primary Care at [facility], Manus Rudd, MD              Passed - Last Heart Rate in normal range    Pulse Readings from Last 1 Encounters:  03/11/19 64

## 2019-12-17 ENCOUNTER — Other Ambulatory Visit: Payer: Self-pay | Admitting: Registered Nurse

## 2019-12-17 DIAGNOSIS — I10 Essential (primary) hypertension: Secondary | ICD-10-CM

## 2019-12-17 NOTE — Telephone Encounter (Signed)
Requested medication (s) are due for refill today: no  Requested medication (s) are on the active medication list: yes  Last refill: 11/23/2019  Future visit scheduled:no  Notes to clinic: overdue for follow up appointment    Requested Prescriptions  Pending Prescriptions Disp Refills   BYSTOLIC 5 MG tablet [Pharmacy Med Name: BYSTOLIC 5 MG TABLET] 30 tablet 0    Sig: TAKE 1 TABLET BY MOUTH EVERY DAY      Cardiovascular:  Beta Blockers Failed - 12/17/2019 11:31 AM      Failed - Last BP in normal range    BP Readings from Last 1 Encounters:  03/11/19 140/80          Failed - Valid encounter within last 6 months    Recent Outpatient Visits           9 months ago Ulcerative blepharitis of left upper eyelid   Primary Care at [facility], Lonna Cobb, NP   9 months ago Essential hypertension   Primary Care at [facility], Lonna Cobb, NP   10 months ago Need for prophylactic vaccination and inoculation against influenza   Primary Care at [facility], Lonna Cobb, NP   1 year ago Erectile disorder due to medical condition in male   Primary Care at [facility], Gerlene Burdock, NP   1 year ago Sore throat   Primary Care at [facility], Manus Rudd, MD              Passed - Last Heart Rate in normal range    Pulse Readings from Last 1 Encounters:  03/11/19 64

## 2019-12-19 ENCOUNTER — Other Ambulatory Visit: Payer: Self-pay

## 2019-12-19 ENCOUNTER — Encounter: Payer: Self-pay | Admitting: Emergency Medicine

## 2019-12-19 ENCOUNTER — Ambulatory Visit
Admission: EM | Admit: 2019-12-19 | Discharge: 2019-12-19 | Disposition: A | Discharge status: Home / Self Care | Payer: 59 | Attending: Emergency Medicine | Admitting: Emergency Medicine

## 2019-12-19 DIAGNOSIS — J069 Acute upper respiratory infection, unspecified: Secondary | ICD-10-CM

## 2019-12-19 DIAGNOSIS — Z1152 Encounter for screening for COVID-19: Secondary | ICD-10-CM

## 2019-12-19 MED ORDER — DEXAMETHASONE 4 MG PO TABS: 4.0000 mg | ORAL_TABLET | Freq: Every day | ORAL | 0 refills | Status: AC

## 2019-12-19 MED ORDER — CETIRIZINE HCL 10 MG PO TABS: 10.0000 mg | ORAL_TABLET | Freq: Every day | ORAL | 0 refills | Status: DC

## 2019-12-19 MED ORDER — BENZONATATE 100 MG PO CAPS: 100.0000 mg | ORAL_CAPSULE | Freq: Three times a day (TID) | ORAL | 0 refills | Status: DC

## 2019-12-19 MED ORDER — FLUTICASONE PROPIONATE 50 MCG/ACT NA SUSP: 1.0000 | Freq: Every day | NASAL | 0 refills | Status: DC

## 2019-12-19 NOTE — Discharge Instructions (Signed)

## 2019-12-19 NOTE — ED Provider Notes (Signed)
[facility]   [id number] 12/19/19 Arrival Time: 1011   CC: COVID symptoms  SUBJECTIVE: History from: patient.  Matthew Hanson is a 44 y.o. male presented to the urgent care for complaint of chills, fever, fatigue, cough, congestion, headache, body aches, loss of taste and smell for the past 1 week.  Report he tested positive for COVID-19 via home Covid test.  Denies sick exposure to COVID, flu or strep.  Denies recent travel.  Has tried OTC medication without relief.  Denies aggravating factors.  Denies previous symptoms in the past.   Denies fever, chills, fatigue, sinus pain, rhinorrhea, sore throat, SOB, wheezing, chest pain, nausea, changes in bowel or bladder habits.     ROS: As per HPI.  All other pertinent ROS negative.      Past Medical History:  Diagnosis Date  . Anxiety   . Hypertension    History reviewed. No pertinent surgical history. No Known Allergies No current facility-administered medications on file prior to encounter.   Current Outpatient Medications on File Prior to Encounter  Medication Sig Dispense Refill  . bacitracin-neomycin-polymyxin-hydrocortisone (CORTISPORIN) 1 % ophthalmic ointment Place 1 application into the left eye 2 (two) times daily. 3.5 g 0  . BYSTOLIC 5 MG tablet TAKE 1 TABLET BY MOUTH EVERY DAY 30 tablet 3  . doxycycline (VIBRA-TABS) 100 MG tablet Take 1 tablet (100 mg total) by mouth 2 (two) times daily. 20 tablet 0  . LORazepam (ATIVAN) 2 MG tablet Take 0.5 tablets (1 mg total) by mouth daily as needed for anxiety. 30 tablet 1  . PARoxetine (PAXIL) 30 MG tablet Take 1 tablet (30 mg total) by mouth daily. 90 tablet 1  . QUEtiapine (SEROQUEL) 25 MG tablet Take 2 tablets (50 mg total) by mouth at bedtime. 180 tablet 1  . tadalafil (CIALIS) 20 MG tablet Take 0.5-1 tablets (10-20 mg total) by mouth every other day as needed for erectile dysfunction. 30 tablet 1   Social History   Socioeconomic History  . Marital status: Divorced      Spouse name: Not on file  . Number of children: 2  . Years of education: Not on file  . Highest education level: Not on file  Occupational History  . Not on file  Tobacco Use  . Smoking status: Never Smoker  . Smokeless tobacco: Never Used  Vaping Use  . Vaping Use: Never used  Substance and Sexual Activity  . Alcohol use: Yes    Alcohol/week: 3.0 standard drinks    Types: 3 Standard drinks or equivalent per week    Comment: weekends  . Drug use: No  . Sexual activity: Yes    Partners: Female    Birth control/protection: None  Other Topics Concern  . Not on file  Social History Narrative  . Not on file   Social Determinants of Health   Financial Resource Strain:   . Difficulty of Paying Living Expenses: Not on file  Food Insecurity:   . Worried About Programme researcher, broadcasting/film/video in the Last Year: Not on file  . Ran Out of Food in the Last Year: Not on file  Transportation Needs:   . Lack of Transportation (Medical): Not on file  . Lack of Transportation (Non-Medical): Not on file  Physical Activity:   . Days of Exercise per Week: Not on file  . Minutes of Exercise per Session: Not on file  Stress:   . Feeling of Stress : Not on file  Social Connections:   .  Frequency of Communication with Friends and Family: Not on file  . Frequency of Social Gatherings with Friends and Family: Not on file  . Attends Religious Services: Not on file  . Active Member of Clubs or Organizations: Not on file  . Attends Banker Meetings: Not on file  . Marital Status: Not on file  Intimate Partner Violence:   . Fear of Current or Ex-Partner: Not on file  . Emotionally Abused: Not on file  . Physically Abused: Not on file  . Sexually Abused: Not on file   Family History  Problem Relation Age of Onset  . Diabetes Father   . Heart disease Father   . Hyperlipidemia Father   . Prostate cancer Father     OBJECTIVE:  Vitals:   12/19/19 1050 12/19/19 1051  BP: 120/85    Pulse: 90   Resp: 19   Temp: 99.9 F (37.7 C)   TempSrc: Oral   SpO2: 94%   Weight:  230 lb (104.3 kg)  Height:  6\' 2"  (1.88 m)     General appearance: alert; appears fatigued, but nontoxic; speaking in full sentences and tolerating own secretions HEENT: NCAT; Ears: EACs clear, TMs pearly gray; Eyes: PERRL.  EOM grossly intact. Sinuses: nontender; Nose: nares patent without rhinorrhea, Throat: oropharynx clear, tonsils non erythematous or enlarged, uvula midline  Neck: supple without LAD Lungs: unlabored respirations, symmetrical air entry; cough: moderate; no respiratory distress; CTAB Heart: regular rate and rhythm.  Radial pulses 2+ symmetrical bilaterally Skin: warm and dry Psychological: alert and cooperative; normal mood and affect  LABS:  No results found for this or any previous visit (from the past 24 hour(s)).   ASSESSMENT & PLAN:  1. URI with cough and congestion   2. Encounter for screening for COVID-19     Meds ordered this encounter  Medications  . benzonatate (TESSALON) 100 MG capsule    Sig: Take 1 capsule (100 mg total) by mouth every 8 (eight) hours.    Dispense:  30 capsule    Refill:  0  . dexamethasone (DECADRON) 4 MG tablet    Sig: Take 1 tablet (4 mg total) by mouth daily for 7 days.    Dispense:  7 tablet    Refill:  0  . fluticasone (FLONASE) 50 MCG/ACT nasal spray    Sig: Place 1 spray into both nostrils daily for 14 days.    Dispense:  16 g    Refill:  0  . cetirizine (ZYRTEC ALLERGY) 10 MG tablet    Sig: Take 1 tablet (10 mg total) by mouth daily.    Dispense:  30 tablet    Refill:  0    Discharge instructions  COVID testing ordered.  It will take between 2-7 days for test results.  Someone will contact you regarding abnormal results.    In the meantime: You should remain isolated in your home for 10 days from symptom onset AND greater than 24 hours after symptoms resolution (absence of fever without the use of fever-reducing  medication and improvement in respiratory symptoms), whichever is longer Get plenty of rest and push fluids Tessalon Perles prescribed for cough Zyrtec for nasal congestion, runny nose, and/or sore throat Flonase for nasal congestion and runny nose Decadron was prescribed Use medications daily for symptom relief Use OTC medications like ibuprofen or tylenol as needed fever or pain Call or go to the ED if you have any new or worsening symptoms such as fever, worsening cough, shortness  of breath, chest tightness, chest pain, turning blue, changes in mental status, etc...   Reviewed expectations re: course of current medical issues. Questions answered. Outlined signs and symptoms indicating need for more acute intervention. Patient verbalized understanding. After Visit Summary given.         Durward Parcel, FNP 12/19/19 1124

## 2019-12-19 NOTE — ED Triage Notes (Signed)
Started having headache, nasal congestion x 1week ago. lose of taste smell , body aches, shortness of breath and fatigue that started this past week.  Had positive at home covid test last night.

## 2019-12-21 LAB — SARS-COV-2, NAA 2 DAY TAT

## 2019-12-21 LAB — NOVEL CORONAVIRUS, NAA: SARS-CoV-2, NAA: DETECTED — AB

## 2019-12-22 ENCOUNTER — Other Ambulatory Visit (HOSPITAL_COMMUNITY): Payer: Self-pay | Admitting: Family

## 2019-12-22 ENCOUNTER — Ambulatory Visit (HOSPITAL_COMMUNITY)
Admission: RE | Admit: 2019-12-22 | Discharge: 2019-12-22 | Disposition: A | Discharge status: Home / Self Care | Payer: 59 | Source: Ambulatory Visit | Attending: Pulmonary Disease | Admitting: Pulmonary Disease

## 2019-12-22 DIAGNOSIS — U071 COVID-19: Secondary | ICD-10-CM

## 2019-12-22 MED ORDER — SODIUM CHLORIDE 0.9 % IV SOLN: INTRAVENOUS | Status: DC | PRN

## 2019-12-22 MED ORDER — EPINEPHRINE 0.3 MG/0.3ML IJ SOAJ: 0.3000 mg | Freq: Once | INTRAMUSCULAR | Status: DC | PRN

## 2019-12-22 MED ORDER — SOTROVIMAB 500 MG/8ML IV SOLN
500.0000 mg | Freq: Once | INTRAVENOUS | Status: AC
  Administered 2019-12-22: 500 mg via INTRAVENOUS

## 2019-12-22 MED ORDER — FAMOTIDINE IN NACL 20-0.9 MG/50ML-% IV SOLN: 20.0000 mg | Freq: Once | INTRAVENOUS | Status: DC | PRN

## 2019-12-22 MED ORDER — DIPHENHYDRAMINE HCL 50 MG/ML IJ SOLN: 50.0000 mg | Freq: Once | INTRAMUSCULAR | Status: DC | PRN

## 2019-12-22 MED ORDER — ALBUTEROL SULFATE HFA 108 (90 BASE) MCG/ACT IN AERS: 2.0000 | INHALATION_SPRAY | Freq: Once | RESPIRATORY_TRACT | Status: DC | PRN

## 2019-12-22 MED ORDER — METHYLPREDNISOLONE SODIUM SUCC 125 MG IJ SOLR: 125.0000 mg | Freq: Once | INTRAMUSCULAR | Status: DC | PRN

## 2019-12-22 NOTE — Progress Notes (Signed)
I connected by phone with Matthew Hanson on 12/22/2019 at 10:09 AM to discuss the potential use of a new treatment for mild to moderate COVID-19 viral infection in non-hospitalized patients.  This patient is a 44 y.o. male that meets the FDA criteria for Emergency Use Authorization of COVID monoclonal antibody casirivimab/imdevimab, bamlanivimab/eteseviamb, or sotrovimab.  Has a (+) direct SARS-CoV-2 viral test result  Has mild or moderate COVID-19   Is NOT hospitalized due to COVID-19  Is within 10 days of symptom onset  Has at least one of the high risk factor(s) for progression to severe COVID-19 and/or hospitalization as defined in EUA.  Specific high risk criteria : BMI > 25 and Cardiovascular disease or hypertension   Symptoms of brain fog, cough, fatigue, aches, loss of taste/smell began 12/14/19 per patient upon my conversation.   I have spoken and communicated the following to the patient or parent/caregiver regarding COVID monoclonal antibody treatment:  1. FDA has authorized the emergency use for the treatment of mild to moderate COVID-19 in adults and pediatric patients with positive results of direct SARS-CoV-2 viral testing who are 90 years of age and older weighing at least 40 kg, and who are at high risk for progressing to severe COVID-19 and/or hospitalization.  2. The significant known and potential risks and benefits of COVID monoclonal antibody, and the extent to which such potential risks and benefits are unknown.  3. Information on available alternative treatments and the risks and benefits of those alternatives, including clinical trials.  4. Patients treated with COVID monoclonal antibody should continue to self-isolate and use infection control measures (e.g., wear mask, isolate, social distance, avoid sharing personal items, clean and disinfect high touch surfaces, and frequent handwashing) according to CDC guidelines.   5. The patient or parent/caregiver has  the option to accept or refuse COVID monoclonal antibody treatment.  After reviewing this information with the patient, the patient has agreed to receive one of the available covid 19 monoclonal antibodies and will be provided an appropriate fact sheet prior to infusion. Matthew Stall, NP 12/22/2019 10:09 AM

## 2019-12-22 NOTE — Discharge Instructions (Signed)

## 2019-12-22 NOTE — Progress Notes (Signed)
°  Diagnosis: COVID-19  Physician: Dr. Wright   Procedure: Sotrovimab Infusion- Provided patient with Sotrovimab fact sheet for patients, parents, and caregivers prior to infusion.   Complications: No immediate complications noted.  Discharge: Discharged home   Kelle Ruppert  B Tavis Kring 12/22/2019   

## 2020-02-23 ENCOUNTER — Other Ambulatory Visit: Payer: Self-pay | Admitting: Registered Nurse

## 2020-02-23 DIAGNOSIS — F410 Panic disorder [episodic paroxysmal anxiety] without agoraphobia: Secondary | ICD-10-CM

## 2020-02-23 DIAGNOSIS — F430 Acute stress reaction: Secondary | ICD-10-CM

## 2020-03-11 ENCOUNTER — Other Ambulatory Visit: Payer: Self-pay | Admitting: Registered Nurse

## 2020-03-11 DIAGNOSIS — F410 Panic disorder [episodic paroxysmal anxiety] without agoraphobia: Secondary | ICD-10-CM

## 2020-03-11 NOTE — Telephone Encounter (Signed)
Requested medication (s) are due for refill today: yes  Requested medication (s) are on the active medication list: yes  Last refill:  09/17/19  Future visit scheduled: no  Notes to clinic:  med not delegated to NT to RF   Requested Prescriptions  Pending Prescriptions Disp Refills   QUEtiapine (SEROQUEL) 25 MG tablet [Pharmacy Med Name: QUETIAPINE FUMARATE 25 MG TAB] 180 tablet 1    Sig: Take 2 tablets (50 mg total) by mouth at bedtime.      Not Delegated - Psychiatry:  Antipsychotics - Second Generation (Atypical) - quetiapine Failed - 03/11/2020  9:24 AM      Failed - This refill cannot be delegated      Failed - ALT in normal range and within 180 days    ALT  Date Value Ref Range Status  02/10/2019 32 0 - 44 IU/L Final          Failed - AST in normal range and within 180 days    AST  Date Value Ref Range Status  02/10/2019 30 0 - 40 IU/L Final          Failed - Completed PHQ-2 or PHQ-9 in the last 360 days      Failed - Valid encounter within last 6 months    Recent Outpatient Visits           1 year ago Ulcerative blepharitis of left upper eyelid   [facility] Care at [facility], Lonna Cobb, NP   1 year ago Essential hypertension   Primary Care at [facility], Lonna Cobb, NP   1 year ago Need for prophylactic vaccination and inoculation against influenza   Primary Care at [facility], Lonna Cobb, NP   1 year ago Erectile disorder due to medical condition in male   [facility] at [facility], Richard, NP   2 years ago Sore throat   Primary Care at [facility], [place] A, MD                Passed - Last BP in normal range    BP Readings from Last 1 Encounters:  12/22/19 134/89

## 2020-03-20 ENCOUNTER — Other Ambulatory Visit: Payer: Self-pay | Admitting: Registered Nurse

## 2020-03-20 DIAGNOSIS — I10 Essential (primary) hypertension: Secondary | ICD-10-CM

## 2020-03-20 NOTE — Telephone Encounter (Signed)
Requested medication (s) are due for refill today: no  Requested medication (s) are on the active medication list: yes  Last refill:  12/20/2019  Future visit scheduled: no  Notes to clinic:  due for 6 month follow up    Requested Prescriptions  Pending Prescriptions Disp Refills   nebivolol (BYSTOLIC) 5 MG tablet [Pharmacy Med Name: NEBIVOLOL 5 MG TABLET] 90 tablet 1    Sig: TAKE 1 TABLET BY MOUTH EVERY DAY      Cardiovascular:  Beta Blockers Failed - 03/20/2020  1:20 AM      Failed - Valid encounter within last 6 months    Recent Outpatient Visits           1 year ago Ulcerative blepharitis of left upper eyelid   Primary Care at 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

## 2020-06-26 ENCOUNTER — Other Ambulatory Visit: Payer: Self-pay | Admitting: Registered Nurse

## 2020-06-26 DIAGNOSIS — I10 Essential (primary) hypertension: Secondary | ICD-10-CM

## 2020-07-24 ENCOUNTER — Other Ambulatory Visit: Payer: Self-pay | Admitting: Registered Nurse

## 2020-07-24 DIAGNOSIS — I10 Essential (primary) hypertension: Secondary | ICD-10-CM

## 2020-09-07 ENCOUNTER — Other Ambulatory Visit: Payer: Self-pay | Admitting: Registered Nurse

## 2020-09-07 DIAGNOSIS — F410 Panic disorder [episodic paroxysmal anxiety] without agoraphobia: Secondary | ICD-10-CM

## 2020-09-07 DIAGNOSIS — F430 Acute stress reaction: Secondary | ICD-10-CM

## 2020-09-07 NOTE — Telephone Encounter (Signed)
LFD 03/13/20 #180 with 1 refill LOV

## 2020-09-11 ENCOUNTER — Other Ambulatory Visit: Payer: Self-pay | Admitting: Registered Nurse

## 2020-09-11 DIAGNOSIS — I10 Essential (primary) hypertension: Secondary | ICD-10-CM

## 2020-09-15 ENCOUNTER — Other Ambulatory Visit: Payer: Self-pay | Admitting: Registered Nurse

## 2020-09-15 ENCOUNTER — Telehealth: Payer: Self-pay

## 2020-09-15 DIAGNOSIS — F32A Depression, unspecified: Secondary | ICD-10-CM

## 2020-09-15 DIAGNOSIS — F430 Acute stress reaction: Secondary | ICD-10-CM

## 2020-09-15 DIAGNOSIS — F410 Panic disorder [episodic paroxysmal anxiety] without agoraphobia: Secondary | ICD-10-CM

## 2020-09-15 DIAGNOSIS — F419 Anxiety disorder, unspecified: Secondary | ICD-10-CM

## 2020-09-15 MED ORDER — QUETIAPINE FUMARATE 25 MG PO TABS: 50.0000 mg | ORAL_TABLET | Freq: Every day | ORAL | 0 refills | Status: DC

## 2020-09-15 NOTE — Telephone Encounter (Signed)
Patient was scheduled for visit on August 12th. Enough medication was sent to get him to appointment, as he ran out today. He was advised that if appointment was missed we would not be able to send any further refills for this medication until he was seen.

## 2020-09-15 NOTE — Telephone Encounter (Signed)
Pt needs to get a week supply of QUEtiapine (SEROQUEL) 25 MG tablet sent to [id number] - [place], [place] - [address] AT [facility]   Pt has a Phy on Friday August 12th   Pt call back [phone]

## 2020-09-22 ENCOUNTER — Other Ambulatory Visit: Payer: Self-pay

## 2020-09-22 ENCOUNTER — Encounter: Payer: Self-pay | Admitting: Registered Nurse

## 2020-09-22 ENCOUNTER — Ambulatory Visit: Payer: 59 | Admitting: Registered Nurse

## 2020-09-22 VITALS — BP 143/94 | HR 70 | Temp 98.3°F | Resp 16 | Ht 74.0 in | Wt 259.7 lb

## 2020-09-22 DIAGNOSIS — I10 Essential (primary) hypertension: Secondary | ICD-10-CM | POA: Diagnosis not present

## 2020-09-22 DIAGNOSIS — Z1159 Encounter for screening for other viral diseases: Secondary | ICD-10-CM

## 2020-09-22 DIAGNOSIS — F410 Panic disorder [episodic paroxysmal anxiety] without agoraphobia: Secondary | ICD-10-CM | POA: Diagnosis not present

## 2020-09-22 DIAGNOSIS — F419 Anxiety disorder, unspecified: Secondary | ICD-10-CM

## 2020-09-22 DIAGNOSIS — F32A Depression, unspecified: Secondary | ICD-10-CM

## 2020-09-22 DIAGNOSIS — R5383 Other fatigue: Secondary | ICD-10-CM | POA: Diagnosis not present

## 2020-09-22 DIAGNOSIS — F430 Acute stress reaction: Secondary | ICD-10-CM

## 2020-09-22 DIAGNOSIS — L989 Disorder of the skin and subcutaneous tissue, unspecified: Secondary | ICD-10-CM

## 2020-09-22 LAB — COMPREHENSIVE METABOLIC PANEL
ALT: 38 U/L (ref 0–53)
AST: 36 U/L (ref 0–37)
Albumin: 4.6 g/dL (ref 3.5–5.2)
Alkaline Phosphatase: 78 U/L (ref 39–117)
BUN: 13 mg/dL (ref 6–23)
CO2: 27 mEq/L (ref 19–32)
Calcium: 9.6 mg/dL (ref 8.4–10.5)
Chloride: 101 mEq/L (ref 96–112)
Creatinine, Ser: 1.16 mg/dL (ref 0.40–1.50)
GFR: 76.29 mL/min (ref >60.00)
Glucose, Bld: 101 mg/dL — ABNORMAL HIGH (ref 70–99)
Potassium: 4.6 mEq/L (ref 3.5–5.1)
Sodium: 137 mEq/L (ref 135–145)
Total Bilirubin: 0.9 mg/dL (ref 0.2–1.2)
Total Protein: 7.3 g/dL (ref 6.0–8.3)

## 2020-09-22 LAB — CBC WITH DIFFERENTIAL/PLATELET
Basophils Absolute: 0 10*3/uL (ref 0.0–0.1)
Basophils Relative: 0.7 % (ref 0.0–3.0)
Eosinophils Absolute: 0.1 10*3/uL (ref 0.0–0.7)
Eosinophils Relative: 0.9 % (ref 0.0–5.0)
HCT: 50.1 % (ref 39.0–52.0)
Hemoglobin: 16.7 g/dL (ref 13.0–17.0)
Lymphocytes Relative: 19.2 % (ref 12.0–46.0)
Lymphs Abs: 1.3 10*3/uL (ref 0.7–4.0)
MCHC: 33.4 g/dL (ref 30.0–36.0)
MCV: 93.5 fl (ref 78.0–100.0)
Monocytes Absolute: 0.5 10*3/uL (ref 0.1–1.0)
Monocytes Relative: 7.4 % (ref 3.0–12.0)
Neutro Abs: 4.8 10*3/uL (ref 1.4–7.7)
Neutrophils Relative %: 71.8 % (ref 43.0–77.0)
Platelets: 260 10*3/uL (ref 150.0–400.0)
RBC: 5.36 Mil/uL (ref 4.22–5.81)
RDW: 14 % (ref 11.5–15.5)
WBC: 6.6 10*3/uL (ref 4.0–10.5)

## 2020-09-22 LAB — LIPID PANEL
Cholesterol: 236 mg/dL — ABNORMAL HIGH (ref 0–200)
HDL: 50.7 mg/dL (ref >39.00)
LDL Cholesterol: 150 mg/dL — ABNORMAL HIGH (ref 0–99)
NonHDL: 185.31
Total CHOL/HDL Ratio: 5
Triglycerides: 176 mg/dL — ABNORMAL HIGH (ref 0.0–149.0)
VLDL: 35.2 mg/dL (ref 0.0–40.0)

## 2020-09-22 LAB — HEMOGLOBIN A1C: Hgb A1c MFr Bld: 5.8 % (ref 4.6–6.5)

## 2020-09-22 LAB — TESTOSTERONE: Testosterone: 563.75 ng/dL (ref 300.00–890.00)

## 2020-09-22 LAB — TSH: TSH: 2.23 u[IU]/mL (ref 0.35–5.50)

## 2020-09-22 MED ORDER — NEBIVOLOL HCL 10 MG PO TABS: 10.0000 mg | ORAL_TABLET | Freq: Every day | ORAL | 3 refills | Status: DC

## 2020-09-22 MED ORDER — LORAZEPAM 2 MG PO TABS: ORAL_TABLET | ORAL | 3 refills | Status: DC

## 2020-09-22 MED ORDER — QUETIAPINE FUMARATE 50 MG PO TABS: 50.0000 mg | ORAL_TABLET | Freq: Every day | ORAL | 1 refills | Status: DC

## 2020-09-22 NOTE — Patient Instructions (Signed)
Mr. Renwick -  Matthew Hanson to see you BP a little high - increase nebivolol to 10mg   Healthy diet - eat real food (less processed foods), appropriate portions, mostly plants Exercise - 30-60 minutes 4-5 days each week.  Refilling lorazepam and seroquel x 6 mo  Referring to derm, but skin lesion appears benign  See you in 6 mo, sooner if labs indicate or if you need anything   Thank you   Rich

## 2020-09-22 NOTE — Progress Notes (Signed)
Established Patient Office Visit  Subjective:  Patient ID: Matthew Hanson, male    DOB: 1975/02/25  Age: 45 y.o. MRN: [id number]  CC:  Chief Complaint  Patient presents with   Medication Refill    Patient states he is here for a medication refill. Patient states he will also like to discuss a spot on his back and might need a referral.    HPI Matthew Hanson presents for med refill, skin lesion,   Hypertension: Patient Currently taking: nebivolol 5mg  PO qd Good effect. No AEs. Denies CV symptoms including: chest pain, shob, doe, headache, visual changes, fatigue, claudication, and dependent edema.   Previous readings and labs: BP Readings from Last 3 Encounters:  09/22/20 (!) 143/94  12/22/19 134/89  12/19/19 120/85   Lab Results  Component Value Date   CREATININE 1.16 02/10/2019    Skin lesion Lower right back Present x 2-3 years Feels like it's larger Mildly elevated - rough texture Uniform color No hx of skin cancer  Hormone Concern for low testosterone Low energy, hx of low libido and ED Feels less "competitive" Wants to check levels.  Otherwise doing well without further concern  Amenable to routine HepC testing today.  Past Medical History:  Diagnosis Date   Anxiety    Hypertension     History reviewed. No pertinent surgical history.  Family History  Problem Relation Age of Onset   Diabetes Father    Heart disease Father    Hyperlipidemia Father    Prostate cancer Father     Social History   Socioeconomic History   Marital status: Significant Other    Spouse name: Not on file   Number of children: 2   Years of education: Not on file   Highest education level: Not on file  Occupational History   Not on file  Tobacco Use   Smoking status: Never   Smokeless tobacco: Never  Vaping Use   Vaping Use: Never used  Substance and Sexual Activity   Alcohol use: Yes    Alcohol/week: 3.0 standard drinks    Types: 3 Standard drinks or  equivalent per week    Comment: weekends   Drug use: No   Sexual activity: Yes    Partners: Female    Birth control/protection: None  Other Topics Concern   Not on file  Social History Narrative   Not on file   Social Determinants of Health   Financial Resource Strain: Not on file  Food Insecurity: Not on file  Transportation Needs: Not on file  Physical Activity: Not on file  Stress: Not on file  Social Connections: Not on file  Intimate Partner Violence: Not on file    Outpatient Medications Prior to Visit  Medication Sig Dispense Refill   LORazepam (ATIVAN) 2 MG tablet TAKE HALF A TABLET (1 MG TOTAL) BY MOUTH DAILY AS NEEDED FOR ANXIETY. 15 tablet 3   nebivolol (BYSTOLIC) 5 MG tablet TAKE 1 TABLET BY MOUTH EVERY DAY 30 tablet 0   QUEtiapine (SEROQUEL) 25 MG tablet TAKE 2 TABLETS BY MOUTH AT BEDTIME. 180 tablet 1   bacitracin-neomycin-polymyxin-hydrocortisone (CORTISPORIN) 1 % ophthalmic ointment Place 1 application into the left eye 2 (two) times daily. 3.5 g 0   benzonatate (TESSALON) 100 MG capsule Take 1 capsule (100 mg total) by mouth every 8 (eight) hours. 30 capsule 0   cetirizine (ZYRTEC ALLERGY) 10 MG tablet Take 1 tablet (10 mg total) by mouth daily. 30 tablet 0   doxycycline (VIBRA-TABS) 100  MG tablet Take 1 tablet (100 mg total) by mouth 2 (two) times daily. 20 tablet 0   fluticasone (FLONASE) 50 MCG/ACT nasal spray Place 1 spray into both nostrils daily for 14 days. 16 g 0   PARoxetine (PAXIL) 30 MG tablet Take 1 tablet (30 mg total) by mouth daily. 90 tablet 1   tadalafil (CIALIS) 20 MG tablet Take 0.5-1 tablets (10-20 mg total) by mouth every other day as needed for erectile dysfunction. 30 tablet 1   No facility-administered medications prior to visit.    No Known Allergies  ROS Review of Systems  Constitutional: Negative.   HENT: Negative.    Eyes: Negative.   Respiratory: Negative.    Cardiovascular: Negative.   Gastrointestinal: Negative.    Genitourinary: Negative.   Musculoskeletal: Negative.   Skin: Negative.   Neurological: Negative.   Psychiatric/Behavioral: Negative.    All other systems reviewed and are negative.    Objective:    Physical Exam Constitutional:      General: He is not in acute distress.    Appearance: Normal appearance. He is normal weight. He is not ill-appearing, toxic-appearing or diaphoretic.  Cardiovascular:     Rate and Rhythm: Normal rate and regular rhythm.     Heart sounds: Normal heart sounds. No murmur heard.   No friction rub. No gallop.  Pulmonary:     Effort: Pulmonary effort is normal. No respiratory distress.     Breath sounds: Normal breath sounds. No stridor. No wheezing, rhonchi or rales.  Chest:     Chest wall: No tenderness.  Skin:    Findings: Lesion (lower r back 2-3cm tan lesion, rough texture, very mildly raise. otherwise multiple benign appearing nevi across torso) present.  Neurological:     General: No focal deficit present.     Mental Status: He is alert and oriented to person, place, and time. Mental status is at baseline.  Psychiatric:        Mood and Affect: Mood normal.        Behavior: Behavior normal.        Thought Content: Thought content normal.        Judgment: Judgment normal.    BP (!) 143/94   Pulse 70   Temp 98.3 F (36.8 C) (Temporal)   Resp 16   Ht 6\' 2"  (1.88 m)   Wt 259 lb 11.2 oz (117.8 kg)   SpO2 99%   BMI 33.34 kg/m  Wt Readings from Last 3 Encounters:  09/22/20 259 lb 11.2 oz (117.8 kg)  12/19/19 230 lb (104.3 kg)  03/11/19 239 lb 12.8 oz (108.8 kg)     Health Maintenance Due  Topic Date Due   Hepatitis C Screening  Never done   COLONOSCOPY (Pts 45-30yrs Insurance coverage will need to be confirmed)  Never done   INFLUENZA VACCINE  09/11/2020    There are no preventive care reminders to display for this patient.  Lab Results  Component Value Date   TSH 1.140 02/10/2019   Lab Results  Component Value Date   WBC  11.9 (H) 02/10/2019   HGB 16.7 02/10/2019   HCT 48.7 02/10/2019   MCV 89 02/10/2019   PLT 335 02/10/2019   Lab Results  Component Value Date   NA 137 02/10/2019   K 4.6 02/10/2019   CO2 25 02/10/2019   GLUCOSE 94 02/10/2019   BUN 12 02/10/2019   CREATININE 1.16 02/10/2019   BILITOT 0.4 02/10/2019   ALKPHOS 109 02/10/2019  AST 30 02/10/2019   ALT 32 02/10/2019   PROT 6.9 02/10/2019   ALBUMIN 4.7 02/10/2019   CALCIUM 9.7 02/10/2019   Lab Results  Component Value Date   CHOL 292 (H) 11/09/2015   Lab Results  Component Value Date   HDL 60 11/09/2015   Lab Results  Component Value Date   LDLCALC 202 (H) 11/09/2015   Lab Results  Component Value Date   TRIG 149 11/09/2015   Lab Results  Component Value Date   CHOLHDL 4.9 11/09/2015   Lab Results  Component Value Date   HGBA1C 5.4 11/09/2015      Assessment & Plan:   Problem List Items Addressed This Visit       Cardiovascular and Mediastinum   Essential hypertension - Primary (Chronic)   Relevant Medications   nebivolol (BYSTOLIC) 10 MG tablet   Other Relevant Orders   CBC with Differential/Platelet   Comprehensive metabolic panel   Hemoglobin A1c   Lipid panel     Other   Anxiety and depression   Relevant Medications   LORazepam (ATIVAN) 2 MG tablet   Other Visit Diagnoses     Panic state as acute reaction to exceptional (gross) stress       Relevant Medications   LORazepam (ATIVAN) 2 MG tablet   QUEtiapine (SEROQUEL) 50 MG tablet   Panic state as acute reaction to exceptional (gross) stress       He has made some good strides but continues to suffer.  I am surprised his panic is pushing through the medication dosages.  Increasing   Relevant Medications   LORazepam (ATIVAN) 2 MG tablet   QUEtiapine (SEROQUEL) 50 MG tablet   Screening for viral disease       Relevant Orders   Hepatitis C Antibody   Low energy       Relevant Orders   Testosterone   TSH   Skin lesion       Relevant  Orders   Ambulatory referral to Dermatology       Meds ordered this encounter  Medications   nebivolol (BYSTOLIC) 10 MG tablet    Sig: Take 1 tablet (10 mg total) by mouth daily.    Dispense:  90 tablet    Refill:  3    Order Specific Question:   Supervising Provider    Answer:   Neva Seat, JEFFREY R [[id number]]   LORazepam (ATIVAN) 2 MG tablet    Sig: TAKE HALF A TABLET (1 MG TOTAL) BY MOUTH DAILY AS NEEDED FOR ANXIETY.    Dispense:  15 tablet    Refill:  3    Not to exceed 4 additional fills before 03/15/2020    Order Specific Question:   Supervising Provider    Answer:   Neva Seat, JEFFREY R [[id number]]   QUEtiapine (SEROQUEL) 50 MG tablet    Sig: Take 1 tablet (50 mg total) by mouth at bedtime.    Dispense:  90 tablet    Refill:  1    Order Specific Question:   Supervising Provider    Answer:   Neva Seat, JEFFREY R [[id number]]    Follow-up: Return in about 6 months (around 03/25/2021) for HTN, anxiety.   PLAN BP mildly elevated today, no symptoms. Increase nebivolol to 10mg  PO  Refill ativan and seroquel Skin lesion appears benign but will refer to derm to ensure. Patient encouraged to call clinic with any questions, comments, or concerns.  , NP

## 2020-09-25 LAB — HEPATITIS C ANTIBODY
Hepatitis C Ab: NONREACTIVE
SIGNAL TO CUT-OFF: 0.02 (ref <1.00)

## 2021-03-10 ENCOUNTER — Other Ambulatory Visit: Payer: Self-pay | Admitting: Registered Nurse

## 2021-03-10 DIAGNOSIS — I10 Essential (primary) hypertension: Secondary | ICD-10-CM

## 2021-03-15 ENCOUNTER — Other Ambulatory Visit: Payer: Self-pay | Admitting: Registered Nurse

## 2021-03-15 DIAGNOSIS — F430 Acute stress reaction: Secondary | ICD-10-CM

## 2021-03-15 DIAGNOSIS — F32A Depression, unspecified: Secondary | ICD-10-CM

## 2021-03-15 DIAGNOSIS — F410 Panic disorder [episodic paroxysmal anxiety] without agoraphobia: Secondary | ICD-10-CM

## 2021-04-10 ENCOUNTER — Other Ambulatory Visit: Payer: Self-pay | Admitting: Registered Nurse

## 2021-04-10 DIAGNOSIS — F430 Acute stress reaction: Secondary | ICD-10-CM

## 2021-04-10 DIAGNOSIS — F410 Panic disorder [episodic paroxysmal anxiety] without agoraphobia: Secondary | ICD-10-CM

## 2021-05-14 ENCOUNTER — Other Ambulatory Visit: Payer: Self-pay | Admitting: Registered Nurse

## 2021-05-14 DIAGNOSIS — F410 Panic disorder [episodic paroxysmal anxiety] without agoraphobia: Secondary | ICD-10-CM

## 2021-05-14 DIAGNOSIS — I10 Essential (primary) hypertension: Secondary | ICD-10-CM

## 2021-05-29 ENCOUNTER — Other Ambulatory Visit: Payer: Self-pay | Admitting: Registered Nurse

## 2021-05-29 DIAGNOSIS — F430 Acute stress reaction: Secondary | ICD-10-CM

## 2021-05-29 NOTE — Telephone Encounter (Signed)
Patient is requesting a refill of the following medications: ?Requested Prescriptions  ? ?Pending Prescriptions Disp Refills  ? LORazepam (ATIVAN) 2 MG tablet [Pharmacy Med Name: LORAZEPAM 2 MG TABLET] 15 tablet 0  ?  Sig: TAKE HALF A TABLET (1 MG TOTAL) BY MOUTH DAILY AS NEEDED FOR ANXIETY  ? ? ?Date of patient request: 05/29/21 ?Last office visit: 09/22/21 ?Date of last refill: 04/10/21 ?Last refill amount: 15 ? ?

## 2021-06-07 ENCOUNTER — Other Ambulatory Visit: Payer: Self-pay | Admitting: Registered Nurse

## 2021-06-07 DIAGNOSIS — I10 Essential (primary) hypertension: Secondary | ICD-10-CM

## 2021-06-11 ENCOUNTER — Other Ambulatory Visit: Payer: Self-pay | Admitting: Registered Nurse

## 2021-06-11 DIAGNOSIS — I10 Essential (primary) hypertension: Secondary | ICD-10-CM

## 2021-06-15 ENCOUNTER — Other Ambulatory Visit: Payer: Self-pay | Admitting: Registered Nurse

## 2021-06-15 DIAGNOSIS — F430 Acute stress reaction: Secondary | ICD-10-CM

## 2021-06-15 DIAGNOSIS — F32A Anxiety disorder, unspecified: Secondary | ICD-10-CM

## 2021-07-06 ENCOUNTER — Other Ambulatory Visit: Payer: Self-pay | Admitting: Registered Nurse

## 2021-07-06 DIAGNOSIS — I10 Essential (primary) hypertension: Secondary | ICD-10-CM

## 2021-07-10 ENCOUNTER — Other Ambulatory Visit: Payer: Self-pay | Admitting: Registered Nurse

## 2021-07-10 DIAGNOSIS — F410 Panic disorder [episodic paroxysmal anxiety] without agoraphobia: Secondary | ICD-10-CM

## 2021-07-10 DIAGNOSIS — I10 Essential (primary) hypertension: Secondary | ICD-10-CM

## 2021-07-20 ENCOUNTER — Other Ambulatory Visit: Payer: Self-pay | Admitting: Family Medicine

## 2021-07-20 DIAGNOSIS — I10 Essential (primary) hypertension: Secondary | ICD-10-CM

## 2021-07-25 ENCOUNTER — Telehealth: Payer: Self-pay | Admitting: Registered Nurse

## 2021-07-25 NOTE — Telephone Encounter (Signed)
PT states that he has been talking Nevivolol 10 mg for 7 years and he has 5 pills left. Richard prescribed Pt with metoprolol 100 mg. Pt want to know if he suppose to start metoprolol right after he finish the Nevivolol 10 mg .

## 2021-07-25 NOTE — Telephone Encounter (Signed)
Can you advise what interval to take this medication if there is any issues overlapping medications

## 2021-07-25 NOTE — Telephone Encounter (Signed)
It looks as though Bystolic was discontinued and metoprolol was ordered January 28th by Rich,  but I do not see any specific reason.  He was continued on Bystolic at his last visit in August 2022, so if there was not a specific reason to change, should take the Bystolic with office visit in the next month as overdue for follow-up.  Should not take both metoprolol and Bystolic.  Let me know if further information needed.

## 2021-07-26 ENCOUNTER — Other Ambulatory Visit: Payer: Self-pay

## 2021-07-26 DIAGNOSIS — I10 Essential (primary) hypertension: Secondary | ICD-10-CM

## 2021-07-26 MED ORDER — NEBIVOLOL HCL 10 MG PO TABS: 5.0000 mg | ORAL_TABLET | Freq: Every day | ORAL | 0 refills | Status: DC

## 2021-07-26 NOTE — Telephone Encounter (Signed)
Called pt and clarified not sure why the metoprolol was sent, resent Bystolic and pt does have follow up made

## 2021-08-08 ENCOUNTER — Ambulatory Visit: Payer: 59 | Admitting: Registered Nurse

## 2021-08-08 ENCOUNTER — Encounter: Payer: Self-pay | Admitting: Registered Nurse

## 2021-08-08 VITALS — BP 140/92 | HR 80 | Temp 98.2°F | Resp 16 | Wt 267.8 lb

## 2021-08-08 DIAGNOSIS — F410 Panic disorder [episodic paroxysmal anxiety] without agoraphobia: Secondary | ICD-10-CM | POA: Diagnosis not present

## 2021-08-08 DIAGNOSIS — F430 Acute stress reaction: Secondary | ICD-10-CM | POA: Diagnosis not present

## 2021-08-08 DIAGNOSIS — I10 Essential (primary) hypertension: Secondary | ICD-10-CM | POA: Diagnosis not present

## 2021-08-08 DIAGNOSIS — Z1211 Encounter for screening for malignant neoplasm of colon: Secondary | ICD-10-CM | POA: Diagnosis not present

## 2021-08-08 LAB — COMPREHENSIVE METABOLIC PANEL
ALT: 62 U/L — ABNORMAL HIGH (ref 0–53)
AST: 55 U/L — ABNORMAL HIGH (ref 0–37)
Albumin: 4.3 g/dL (ref 3.5–5.2)
Alkaline Phosphatase: 77 U/L (ref 39–117)
BUN: 11 mg/dL (ref 6–23)
CO2: 28 mEq/L (ref 19–32)
Calcium: 9.1 mg/dL (ref 8.4–10.5)
Chloride: 102 mEq/L (ref 96–112)
Creatinine, Ser: 1.3 mg/dL (ref 0.40–1.50)
GFR: 66.14 mL/min (ref >60.00)
Glucose, Bld: 108 mg/dL — ABNORMAL HIGH (ref 70–99)
Potassium: 4.3 mEq/L (ref 3.5–5.1)
Sodium: 139 mEq/L (ref 135–145)
Total Bilirubin: 0.7 mg/dL (ref 0.2–1.2)
Total Protein: 7.1 g/dL (ref 6.0–8.3)

## 2021-08-08 LAB — LIPID PANEL
Cholesterol: 251 mg/dL — ABNORMAL HIGH (ref 0–200)
HDL: 52.2 mg/dL (ref >39.00)
NonHDL: 198.61
Total CHOL/HDL Ratio: 5
Triglycerides: 231 mg/dL — ABNORMAL HIGH (ref 0.0–149.0)
VLDL: 46.2 mg/dL — ABNORMAL HIGH (ref 0.0–40.0)

## 2021-08-08 LAB — LDL CHOLESTEROL, DIRECT: Direct LDL: 167 mg/dL

## 2021-08-08 LAB — HEMOGLOBIN A1C: Hgb A1c MFr Bld: 5.6 % (ref 4.6–6.5)

## 2021-08-08 MED ORDER — LORAZEPAM 2 MG PO TABS: 1.0000 mg | ORAL_TABLET | Freq: Every day | ORAL | 5 refills | Status: DC | PRN

## 2021-08-08 MED ORDER — NEBIVOLOL HCL 10 MG PO TABS: 10.0000 mg | ORAL_TABLET | Freq: Every day | ORAL | 1 refills | Status: DC

## 2021-08-08 NOTE — Assessment & Plan Note (Signed)
Continue prn lorazepam. Reviewed risks, benefits, and side effects, pt voices understanding.

## 2021-08-08 NOTE — Patient Instructions (Addendum)
Mr. Anderle -   Doristine Devoid to see you  Nebivolol to 27m daily. Let me know if side effects result.  Refilled lorazepam, continue to only use when needed.  Will get labs today - will let you know how they look  I will send cologuard kit to your home. Please complete this within 2 weeks of receiving.  Thank you  Rich

## 2021-08-08 NOTE — Assessment & Plan Note (Signed)
He will pursue lifestyle modifications including regular exercise and high fiber diet. Recheck in 6 mo at CPE. Labs collected. Will follow up with the patient as warranted.

## 2021-08-08 NOTE — Progress Notes (Signed)
Established Patient Office Visit  Subjective:  Patient ID: Matthew Hanson, male    DOB: 1975/03/16  Age: 46 y.o. MRN: [id number]  CC:  Chief Complaint  Patient presents with   Hypertension    Recheck medication   Anxiety    Needs med refill    HPI Matthew Hanson presents for HTN, anxiety  Hypertension: Patient Currently taking: nebivolol 10mg  po qd Good effect. No AEs. Denies CV symptoms including: chest pain, shob, doe, headache, visual changes, fatigue, claudication, and dependent edema.   Previous readings and labs: BP Readings from Last 3 Encounters:  08/08/21 (!) 140/92  09/22/20 (!) 143/94  12/22/19 134/89   Lab Results  Component Value Date   CREATININE 1.16 09/22/2020    Anxiety PRN use of lorazepam 1mg  qd prn.  Good effect, no AE.  Uses sparingly. Last refill 05/31/21 #30 no refills per pdmp, no abnormalities.  HM Due for colon ca screen.  No fam hx of colon ca.  No symptoms of concern at this time.  Outpatient Medications Prior to Visit  Medication Sig Dispense Refill   QUEtiapine (SEROQUEL) 25 MG tablet TAKE 2 TABLETS BY MOUTH AT BEDTIME 180 tablet 1   LORazepam (ATIVAN) 2 MG tablet TAKE HALF A TABLET (1 MG TOTAL) BY MOUTH DAILY AS NEEDED FOR ANXIETY 15 tablet 0   nebivolol (BYSTOLIC) 10 MG tablet Take 0.5 tablets (5 mg total) by mouth daily. 90 tablet 0   bacitracin-neomycin-polymyxin-hydrocortisone (CORTISPORIN) 1 % ophthalmic ointment Place 1 application into the left eye 2 (two) times daily. 3.5 g 0   benzonatate (TESSALON) 100 MG capsule Take 1 capsule (100 mg total) by mouth every 8 (eight) hours. 30 capsule 0   cetirizine (ZYRTEC ALLERGY) 10 MG tablet Take 1 tablet (10 mg total) by mouth daily. 30 tablet 0   doxycycline (VIBRA-TABS) 100 MG tablet Take 1 tablet (100 mg total) by mouth 2 (two) times daily. 20 tablet 0   fluticasone (FLONASE) 50 MCG/ACT nasal spray Place 1 spray into both nostrils daily for 14 days. 16 g 0   metoprolol tartrate  (LOPRESSOR) 100 MG tablet TAKE 1 TABLET BY MOUTH TWICE A DAY (Patient not taking: Reported on 08/08/2021) 180 tablet 1   PARoxetine (PAXIL) 30 MG tablet Take 1 tablet (30 mg total) by mouth daily. 90 tablet 1   QUEtiapine (SEROQUEL) 50 MG tablet TAKE 1 TABLET BY MOUTH EVERYDAY AT BEDTIME (Patient not taking: Reported on 08/08/2021) 90 tablet 1   tadalafil (CIALIS) 20 MG tablet Take 0.5-1 tablets (10-20 mg total) by mouth every other day as needed for erectile dysfunction. 30 tablet 1   No facility-administered medications prior to visit.    Review of Systems  Constitutional: Negative.   HENT: Negative.    Eyes: Negative.   Respiratory: Negative.    Cardiovascular: Negative.   Gastrointestinal: Negative.   Endocrine: Negative.   Genitourinary: Negative.   Musculoskeletal: Negative.   Skin: Negative.   Allergic/Immunologic: Negative.   Neurological: Negative.   Hematological: Negative.   Psychiatric/Behavioral: Negative.        Objective:     BP (!) 140/92   Pulse 80   Temp 98.2 F (36.8 C)   Resp 16   Wt 267 lb 12.8 oz (121.5 kg)   SpO2 98%   BMI 34.38 kg/m   Wt Readings from Last 3 Encounters:  08/08/21 267 lb 12.8 oz (121.5 kg)  09/22/20 259 lb 11.2 oz (117.8 kg)  12/19/19 230 lb (104.3 kg)  Physical Exam Constitutional:      General: He is not in acute distress.    Appearance: Normal appearance. He is normal weight. He is not ill-appearing, toxic-appearing or diaphoretic.  Cardiovascular:     Rate and Rhythm: Normal rate and regular rhythm.     Heart sounds: Normal heart sounds. No murmur heard.    No friction rub. No gallop.  Pulmonary:     Effort: Pulmonary effort is normal. No respiratory distress.     Breath sounds: Normal breath sounds. No stridor. No wheezing, rhonchi or rales.  Chest:     Chest wall: No tenderness.  Neurological:     General: No focal deficit present.     Mental Status: He is alert and oriented to person, place, and time. Mental  status is at baseline.  Psychiatric:        Mood and Affect: Mood normal.        Behavior: Behavior normal.        Thought Content: Thought content normal.        Judgment: Judgment normal.     No results found for any visits on 08/08/21.    The ASCVD Risk score (Arnett DK, et al., 2019) failed to calculate for the following reasons:   Unable to determine if patient is Non-Hispanic African American    Assessment & Plan:   Problem List Items Addressed This Visit       Cardiovascular and Mediastinum   Essential hypertension (Chronic)    He will pursue lifestyle modifications including regular exercise and high fiber diet. Recheck in 6 mo at CPE. Labs collected. Will follow up with the patient as warranted.       Relevant Medications   nebivolol (BYSTOLIC) 10 MG tablet   Other Relevant Orders   Comprehensive metabolic panel   Lipid panel   Hemoglobin A1c     Other   Panic state as acute reaction to exceptional (gross) stress - Primary    Continue prn lorazepam. Reviewed risks, benefits, and side effects, pt voices understanding.       Relevant Medications   LORazepam (ATIVAN) 2 MG tablet   Other Visit Diagnoses     Screen for colon cancer       Relevant Orders   Cologuard       Meds ordered this encounter  Medications   LORazepam (ATIVAN) 2 MG tablet    Sig: Take 0.5 tablets (1 mg total) by mouth daily as needed for anxiety.    Dispense:  15 tablet    Refill:  5    Not to exceed 5 additional fills before 10/07/2021   nebivolol (BYSTOLIC) 10 MG tablet    Sig: Take 1 tablet (10 mg total) by mouth daily.    Dispense:  90 tablet    Refill:  1    Order Specific Question:   Supervising Provider    Answer:   Neva Seat, JEFFREY R [[id number]]    Return in about 6 months (around 02/07/2022) for CPE and labs.    Matthew Agee, NP

## 2021-08-09 ENCOUNTER — Other Ambulatory Visit: Payer: Self-pay | Admitting: Registered Nurse

## 2021-08-09 DIAGNOSIS — R7989 Other specified abnormal findings of blood chemistry: Secondary | ICD-10-CM | POA: Insufficient documentation

## 2021-10-10 ENCOUNTER — Telehealth: Payer: Self-pay

## 2021-10-10 LAB — COLOGUARD: COLOGUARD: NEGATIVE

## 2021-10-10 NOTE — Telephone Encounter (Signed)
-----   Message from Sheliah Hatch, MD sent at 10/10/2021  7:16 AM EDT ----- Normal cologuard- great news!

## 2021-10-31 ENCOUNTER — Ambulatory Visit
Admission: EM | Admit: 2021-10-31 | Discharge: 2021-10-31 | Disposition: A | Discharge status: Home / Self Care | Payer: 59 | Attending: Nurse Practitioner | Admitting: Nurse Practitioner

## 2021-10-31 DIAGNOSIS — L03012 Cellulitis of left finger: Secondary | ICD-10-CM

## 2021-10-31 MED ORDER — DOXYCYCLINE HYCLATE 100 MG PO TABS: 100.0000 mg | ORAL_TABLET | Freq: Two times a day (BID) | ORAL | 0 refills | Status: DC

## 2021-10-31 NOTE — ED Provider Notes (Signed)
[facility]    CSN: [id number] Arrival date & time: 10/31/21  1632      History   Chief Complaint Chief Complaint  Patient presents with   finger problem    HPI Matthew Hanson is a 46 y.o. male.   The history is provided by the patient.    Patient presents with a 3 to 4-day history of swelling around the nailbed of the left middle finger.  Patient states over the past 24 to 48 hours, he has had increased pain, swelling, and tenderness.  He denies any known injury or trauma, but states that he did cut his fingernails prior to his symptoms starting.  He denies any fever, chills, chest pain, abdominal pain, nausea, vomiting, or diarrhea.  He states that he has been applying an ointment to the area with out relief.  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 No pertinent surgical history.     Home Medications    Prior to Admission medications   Medication Sig Start Date End Date Taking? Authorizing Provider  doxycycline (VIBRA-TABS) 100 MG tablet Take 1 tablet (100 mg total) by mouth 2 (two) times daily. 10/31/21  Yes Hamza Empson-Warren, Sadie Haber, NP  LORazepam (ATIVAN) 2 MG tablet Take 0.5 tablets (1 mg total) by mouth daily as needed for anxiety. 08/08/21   Janeece Agee, NP  nebivolol (BYSTOLIC) 10 MG tablet Take 1 tablet (10 mg total) by mouth daily. 08/08/21   Janeece Agee, NP  QUEtiapine (SEROQUEL) 25 MG tablet TAKE 2 TABLETS BY MOUTH AT BEDTIME 06/15/21   Janeece Agee, NP    Family History Family History  Problem Relation Age of Onset   Diabetes Father    Heart disease Father    Hyperlipidemia  Father    Prostate cancer Father     Social History Social History   Tobacco Use   Smoking status: Never   Smokeless tobacco: Never  Vaping Use   Vaping Use: Never used  Substance Use Topics   Alcohol use: Yes    Alcohol/week: 3.0 standard drinks of alcohol    Types: 3 Standard drinks or equivalent per week    Comment: weekends   Drug use: No     Allergies   Patient has no known allergies.   Review of Systems Review of Systems Per HPI  Physical Exam Triage Vital Signs ED Triage Vitals  Enc Vitals Group     BP 10/31/21 1643 (!) 166/108     Pulse Rate 10/31/21 1643 71     Resp 10/31/21 1643 18     Temp 10/31/21 1643 99 F (37.2 C)     Temp Source 10/31/21 1643 Oral     SpO2 10/31/21 1643 96 %     Weight --      Height --      Head Circumference --      Peak Flow --      Pain Score 10/31/21 1642 8     Pain Loc --      Pain Edu? --      Excl. in GC? --    No data  found.  Updated Vital Signs BP (!) 166/108 (BP Location: Right Arm)   Pulse 71   Temp 99 F (37.2 C) (Oral)   Resp 18   SpO2 96%   Visual Acuity Right Eye Distance:   Left Eye Distance:   Bilateral Distance:    Right Eye Near:   Left Eye Near:    Bilateral Near:     Physical Exam Vitals and nursing note reviewed.  Constitutional:      Appearance: Normal appearance. He is not toxic-appearing.  HENT:     Head: Normocephalic.  Eyes:     Extraocular Movements: Extraocular movements intact.     Conjunctiva/sclera: Conjunctivae normal.     Pupils: Pupils are equal, round, and reactive to light.  Pulmonary:     Effort: Pulmonary effort is normal.  Musculoskeletal:     Left hand: Swelling and tenderness present. Normal range of motion. Normal sensation. There is no disruption of two-point discrimination. Normal capillary refill. Normal pulse.     Cervical back: Normal range of motion.     Comments: Paronychia noted to the left middle finger.  Area is tender to palpation.  There is no  fluctuance, oozing, or drainage present.  Skin:    General: Skin is warm and dry.  Neurological:     General: No focal deficit present.     Mental Status: He is alert and oriented to person, place, and time.  Psychiatric:        Mood and Affect: Mood normal.        Behavior: Behavior normal.      UC Treatments / Results  Labs (all labs ordered are listed, but only abnormal results are displayed) Labs Reviewed - No data to display  EKG   Radiology No results found.  Procedures Procedures (including critical care time)  Medications Ordered in UC Medications - No data to display  Initial Impression / Assessment and Plan / UC Course  I have reviewed the triage vital signs and the nursing notes.  Pertinent labs & imaging results that were available during my care of the patient were reviewed by me and considered in my medical decision making (see chart for details).  Patient presents with paronychia of the left middle finger.  On exam, he has moderate tenderness, and redness around the nailbed of the left middle finger.  Area is not fluctuant at this time, therefore will forego I&D.  Will start doxycycline for the patient along with providing supportive care recommendations.  Patient was also given strict indications of when to follow-up for further evaluation.  Patient verbalizes understanding.  All questions were answered. Final Clinical Impressions(s) / UC Diagnoses   Final diagnoses:  Paronychia of left middle finger  Cellulitis of left middle finger     Discharge Instructions      Take medication as prescribed. Warm salt water soaks or Epsom salt soaks to help with pain and swelling. May take over-the-counter ibuprofen to help with pain and inflammation. Follow-up in this clinic if you develop worsening redness that goes up or down the finger, foul-smelling drainage, or if you develop fever, chills, abdominal pain, nausea, vomiting, or diarrhea. Follow-up with  primary care physician if symptoms fail to improve.      ED Prescriptions     Medication Sig Dispense Auth. Provider   doxycycline (VIBRA-TABS) 100 MG tablet Take 1 tablet (100 mg total) by mouth 2 (two) times daily. 14 tablet Monterrio Gerst-Warren, Alda Lea, NP      PDMP  not reviewed this encounter.   Abran Cantor, NP 10/31/21 1704

## 2021-10-31 NOTE — Discharge Instructions (Addendum)
Take medication as prescribed. Warm salt water soaks or Epsom salt soaks to help with pain and swelling. May take over-the-counter ibuprofen to help with pain and inflammation. Follow-up in this clinic if you develop worsening redness that goes up or down the finger, foul-smelling drainage, or if you develop fever, chills, abdominal pain, nausea, vomiting, or diarrhea. Follow-up with primary care physician if symptoms fail to improve.

## 2021-10-31 NOTE — ED Triage Notes (Signed)
Pt reports swelling, redness and pain in the left middle finger x 3-4 days.

## 2022-02-14 ENCOUNTER — Encounter: Payer: 59 | Admitting: Registered Nurse

## 2022-02-14 ENCOUNTER — Other Ambulatory Visit: Payer: Self-pay | Admitting: Registered Nurse

## 2022-02-14 DIAGNOSIS — F419 Anxiety disorder, unspecified: Secondary | ICD-10-CM

## 2022-02-14 DIAGNOSIS — I10 Essential (primary) hypertension: Secondary | ICD-10-CM

## 2022-02-14 DIAGNOSIS — F410 Panic disorder [episodic paroxysmal anxiety] without agoraphobia: Secondary | ICD-10-CM

## 2022-02-14 MED ORDER — LORAZEPAM 2 MG PO TABS: 1.0000 mg | ORAL_TABLET | Freq: Every day | ORAL | 0 refills | Status: DC | PRN

## 2022-02-14 MED ORDER — QUETIAPINE FUMARATE 25 MG PO TABS: 50.0000 mg | ORAL_TABLET | Freq: Every day | ORAL | 0 refills | Status: DC

## 2022-02-14 MED ORDER — NEBIVOLOL HCL 10 MG PO TABS: 10.0000 mg | ORAL_TABLET | Freq: Every day | ORAL | 0 refills | Status: DC

## 2022-02-14 NOTE — Telephone Encounter (Signed)
Noted appointment scheduled with me January 8.  I can discuss his medications at that time, but will need to establish with new primary care provider and can discuss plan with these medications further at upcoming appointment.  Last visit with Freda Jackson noted in June 2023.  Controlled substance database reviewed.  Lorazepam 2 mg #15 filled on 01/11/2022, previously 12/11/2021, 11/12/2021, 10/10/2021.  Temporary refill of lorazepam, Bystolic, Seroquel given.

## 2022-02-14 NOTE — Telephone Encounter (Signed)
Patient called stating that he needed medications refilled. I let him know that Delfino Lovett is no longer here and he would need to be seen before having medicines refilled. He stated that he is out of some of them or will be soon. I did let him know that we didn't have anything until Monday but I would send Dr Carlota Raspberry a message to see if he felt comfortable sending in the meds until his appt. Patient was ok with this.

## 2022-02-15 NOTE — Telephone Encounter (Signed)
Pt informed

## 2022-02-18 ENCOUNTER — Encounter: Payer: Self-pay | Admitting: Family Medicine

## 2022-02-18 ENCOUNTER — Ambulatory Visit: Payer: 59 | Admitting: Family Medicine

## 2022-02-18 VITALS — BP 128/84 | HR 63 | Temp 98.5°F | Wt 250.0 lb

## 2022-02-18 DIAGNOSIS — I10 Essential (primary) hypertension: Secondary | ICD-10-CM

## 2022-02-18 DIAGNOSIS — E785 Hyperlipidemia, unspecified: Secondary | ICD-10-CM

## 2022-02-18 DIAGNOSIS — F419 Anxiety disorder, unspecified: Secondary | ICD-10-CM | POA: Diagnosis not present

## 2022-02-18 MED ORDER — BUSPIRONE HCL 7.5 MG PO TABS: 7.5000 mg | ORAL_TABLET | Freq: Two times a day (BID) | ORAL | 1 refills | Status: DC

## 2022-02-18 MED ORDER — NEBIVOLOL HCL 10 MG PO TABS: 10.0000 mg | ORAL_TABLET | Freq: Every day | ORAL | 1 refills | Status: DC

## 2022-02-18 NOTE — Patient Instructions (Addendum)
Ok to remain on seroquel for sleep.  See info below on stress and and anxiety management.  Continue exercise, keep up the good work with weight loss! Ok to continue the lorazepam for now, but as we discussed need to look at other daily med for anxiety with lorazepam only as needed as the goal. Try low dose buspar twice per day, and recheck in 6 weeks with new primary provider to discuss that med and possible dose adjustments. Let me know if there are questions in the meantime. Hang in there.   Here are a few options for counseling:  [facility]:  [place] [phone]   Managing Stress, Adult Feeling a certain amount of stress is normal. Stress helps our body and mind get ready to deal with the demands of life. Stress hormones can motivate you to do well at work and meet your responsibilities. But severe or long-term (chronic) stress can affect your mental and physical health. Chronic stress puts you at higher risk for: Anxiety and depression. Other health problems such as digestive problems, muscle aches, heart disease, high blood pressure, and stroke. What are the causes? Common causes of stress include: Demands from work, such as deadlines, feeling overworked, or having long hours. Pressures at home, such as money issues, disagreements with a spouse, or parenting issues. Pressures from major life changes, such as divorce, moving, loss of a loved one, or chronic illness. You may be at higher risk for stress-related problems if you: Do not get enough sleep. Are in poor health. Do not have emotional support. Have a mental health disorder such as anxiety or depression. How to recognize stress Stress can make you: Have trouble sleeping. Feel sad, anxious, irritable, or overwhelmed. Lose your appetite. Overeat or want to eat unhealthy foods. Want to use drugs or alcohol. Stress can also cause physical symptoms, such as: Sore,  tense muscles, especially in the shoulders and neck. Headaches. Trouble breathing. A faster heart rate. Stomach pain, nausea, or vomiting. Diarrhea or constipation. Trouble concentrating. Follow these instructions at home: Eating and drinking Eat a healthy diet. This includes: Eating foods that are high in fiber, such as beans, whole grains, and fresh fruits and vegetables. Limiting foods that are high in fat and processed sugars, such as fried or sweet foods. Do not skip meals or overeat. Drink enough fluid to keep your urine pale yellow. Alcohol use Do not drink alcohol if: Your health care provider tells you not to drink. You are pregnant, may be pregnant, or are planning to become pregnant. Drinking alcohol is a way some people try to ease their stress. This can be dangerous, so if you drink alcohol: Limit how much you have to: 0-1 drink a day for women. 0-2 drinks a day for men. Know how much alcohol is in your drink. In the [place] one drink equals one 12 oz bottle of beer (355 mL), one 5 oz glass of wine (148 mL), or one 1 oz glass of hard liquor (44 mL). Activity  Include 30 minutes of exercise in your daily schedule. Exercise is a good stress reducer. Include time in your day for an activity that you find relaxing. Try taking a walk, going on a bike ride, reading a book, or listening to music. Schedule your time in a way that lowers stress, and keep a regular schedule. Focus on doing what is most important to get done. Lifestyle Identify the source of your stress and your reaction to it.  See a therapist who can help you change unhelpful reactions. When there are stressful events: Talk about them with family, friends, or coworkers. Try to think realistically about stressful events and not ignore them or overreact. Try to find the positives in a stressful situation and not focus on the negatives. Cut back on responsibilities at work and home, if possible. Ask for help from  friends or family members if you need it. Find ways to manage stress, such as: Mindfulness, meditation, or deep breathing. Yoga or tai chi. Progressive muscle relaxation. Spending time in nature. Doing art, playing music, or reading. Making time for fun activities. Spending time with family and friends. Get support from family, friends, or spiritual resources. General instructions Get enough sleep. Try to go to sleep and get up at about the same time every day. Take over-the-counter and prescription medicines only as told by your health care provider. Do not use any products that contain nicotine or tobacco. These products include cigarettes, chewing tobacco, and vaping devices, such as e-cigarettes. If you need help quitting, ask your health care provider. Do not use drugs or smoke to deal with stress. Keep all follow-up visits. This is important. Where to find support Talk with your health care provider about stress management or finding a support group. Find a therapist to work with you on your stress management techniques. Where to find more information [employer] on Mental Illness: www.nami.org American Psychological Association: TVSte[employer].ch Contact a health care provider if: Your stress symptoms get worse. You are unable to manage your stress at home. You are struggling to stop using drugs or alcohol. Get help right away if: You may be a danger to yourself or others. You have any thoughts of death or suicide. Get help right awayif you feel like you may hurt yourself or others, or have thoughts about taking your own life. Go to your nearest emergency room or: Call 911. Call the [place] at (936) 253-2674 or 988 in the [place] This is open 24 hours a day. Text the Crisis Text Line at (210)502-4221. Summary Feeling a certain amount of stress is normal, but severe or long-term (chronic) stress can affect your mental and physical health. Chronic stress  can put you at higher risk for anxiety, depression, and other health problems such as digestive problems, muscle aches, heart disease, high blood pressure, and stroke. You may be at higher risk for stress-related problems if you do not get enough sleep, are in poor health, lack emotional support, or have a mental health disorder such as anxiety or depression. Identify the source of your stress and your reaction to it. Try talking about stressful events with family, friends, or coworkers, finding a coping method, or getting support from spiritual resources. If you need more help, talk with your health care provider about finding a support group or a mental health therapist. This information is not intended to replace advice given to you by your health care provider. Make sure you discuss any questions you have with your health care provider. Document Revised: 08/24/2020 Document Reviewed: 08/22/2020 Elsevier Patient Education  [place], Adult After being diagnosed with anxiety, you may be relieved to know why you have felt or behaved a certain way. You may also feel overwhelmed about the treatment ahead and what it will mean for your life. With care and support, you can manage this condition. How to manage lifestyle changes Managing stress and anxiety  Stress is your  body's reaction to life changes and events, both good and bad. Most stress will last just a few hours, but stress can be ongoing and can lead to more than just stress. Although stress can play a major role in anxiety, it is not the same as anxiety. Stress is usually caused by something external, such as a deadline, test, or competition. Stress normally passes after the triggering event has ended.  Anxiety is caused by something internal, such as imagining a terrible outcome or worrying that something will go wrong that will devastate you. Anxiety often does not go away even after the triggering event is over,  and it can become long-term (chronic) worry. It is important to understand the differences between stress and anxiety and to manage your stress effectively so that it does not lead to an anxious response. Talk with your health care provider or a counselor to learn more about reducing anxiety and stress. He or she may suggest tension reduction techniques, such as: Music therapy. Spend time creating or listening to music that you enjoy and that inspires you. Mindfulness-based meditation. Practice being aware of your normal breaths while not trying to control your breathing. It can be done while sitting or walking. Centering prayer. This involves focusing on a word, phrase, or sacred image that means something to you and brings you peace. Deep breathing. To do this, expand your stomach and inhale slowly through your nose. Hold your breath for 3-5 seconds. Then exhale slowly, letting your stomach muscles relax. Self-talk. Learn to notice and identify thought patterns that lead to anxiety reactions and change those patterns to thoughts that feel peaceful. Muscle relaxation. Taking time to tense muscles and then relax them. Choose a tension reduction technique that fits your lifestyle and personality. These techniques take time and practice. Set aside 5-15 minutes a day to do them. Therapists can offer counseling and training in these techniques. The training to help with anxiety may be covered by some insurance plans. Other things you can do to manage stress and anxiety include: Keeping a stress diary. This can help you learn what triggers your reaction and then learn ways to manage your response. Thinking about how you react to certain situations. You may not be able to control everything, but you can control your response. Making time for activities that help you relax and not feeling guilty about spending your time in this way. Doing visual imagery. This involves imagining or creating mental pictures to  help you relax. Practicing yoga. Through yoga poses, you can lower tension and promote relaxation.  Medicines Medicines can help ease symptoms. Medicines for anxiety include: Antidepressant medicines. These are usually prescribed for long-term daily control. Anti-anxiety medicines. These may be added in severe cases, especially when panic attacks occur. Medicines will be prescribed by a health care provider. When used together, medicines, psychotherapy, and tension reduction techniques may be the most effective treatment. Relationships Relationships can play a big part in helping you recover. Try to spend more time connecting with trusted friends and family members. Consider going to couples counseling if you have a partner, taking family education classes, or going to family therapy. Therapy can help you and others better understand your condition. How to recognize changes in your anxiety Everyone responds differently to treatment for anxiety. Recovery from anxiety happens when symptoms decrease and stop interfering with your daily activities at home or work. This may mean that you will start to: Have better concentration and focus. Worry will interfere less  in your daily thinking. Sleep better. Be less irritable. Have more energy. Have improved memory. It is also important to recognize when your condition is getting worse. Contact your health care provider if your symptoms interfere with home or work and you feel like your condition is not improving. Follow these instructions at home: Activity Exercise. Adults should do the following: Exercise for at least 150 minutes each week. The exercise should increase your heart rate and make you sweat (moderate-intensity exercise). Strengthening exercises at least twice a week. Get the right amount and quality of sleep. Most adults need 7-9 hours of sleep each night. Lifestyle  Eat a healthy diet that includes plenty of vegetables, fruits, whole  grains, low-fat dairy products, and lean protein. Do not eat a lot of foods that are high in fats, added sugars, or salt (sodium). Make choices that simplify your life. Do not use any products that contain nicotine or tobacco. These products include cigarettes, chewing tobacco, and vaping devices, such as e-cigarettes. If you need help quitting, ask your health care provider. Avoid caffeine, alcohol, and certain over-the-counter cold medicines. These may make you feel worse. Ask your pharmacist which medicines to avoid. General instructions Take over-the-counter and prescription medicines only as told by your health care provider. Keep all follow-up visits. This is important. Where to find support You can get help and support from these sources: Self-help groups. Online and [employer]. A trusted spiritual leader. Couples counseling. Family education classes. Family therapy. Where to find more information You may find that joining a support group helps you deal with your anxiety. The following sources can help you locate counselors or support groups near you: [place]: www.mentalhealthamerica.net Anxiety and Depression Association of [place] (ADAA): https://www.clark.net/ National Alliance on Mental Illness (NAMI): www.nami.org Contact a health care provider if: You have a hard time staying focused or finishing daily tasks. You spend many hours a day feeling worried about everyday life. You become exhausted by worry. You start to have headaches or frequently feel tense. You develop chronic nausea or diarrhea. Get help right away if: You have a racing heart and shortness of breath. You have thoughts of hurting yourself or others. If you ever feel like you may hurt yourself or others, or have thoughts about taking your own life, get help right away. Go to your nearest emergency department or: Call your local emergency services (911 in the [place] Call a suicide crisis  helpline, such as the [place] at (920) 772-3099 or 988 in the [place]. This is open 24 hours a day in the [place] Text the Crisis Text Line at 724-438-9978 (in the [place].). Summary Taking steps to learn and use tension reduction techniques can help calm you and help prevent triggering an anxiety reaction. When used together, medicines, psychotherapy, and tension reduction techniques may be the most effective treatment. Family, friends, and partners can play a big part in supporting you. This information is not intended to replace advice given to you by your health care provider. Make sure you discuss any questions you have with your health care provider. Document Revised: 08/23/2020 Document Reviewed: 05/21/2020 Elsevier Patient Education  [place].

## 2022-02-18 NOTE — Progress Notes (Signed)
Subjective:  Patient ID: Matthew Hanson, male    DOB: 01/19/1976  Age: 47 y.o. MRN: [id number]  CC:  Chief Complaint  Patient presents with   Anxiety    HPI Matthew Hanson presents for   Med refill.  Plans to establish with new PCP.   Anxiety, insomnia Previous patient of Janeece Agee with last visit August 08, 2021.  At that time continued on lorazepam 1 mg as needed.  He is also taking Seroquel 50 mg nightly - working well. Tried lower dose when running out in past, did not feel well, felt mind was rushing, negative thoughts, no manic thoughts. No FH of Bipolar.  Taking lorazepam as needed - taking once per day, usually 4 times per week.   47yo with autism, GF dtr also with special needs. Family and relationship stressors, work stressors. Father diagnosed with Cancer past week.  Prior paroxetine - none in past year. Felt some anhedonia on that med, as well as celexa. Tried wellbutrin in past - no sexual side effects. Unknown if beneficial for anxiety.  Buspar in past  - no known side effects. Unknown dose.  No current therapist - met with one once in past. Only 1 visit.  Exercise - 1-2 days per week, eating better.  Brother had zoloft and prozac. Sexual side effects.   Wt Readings from Last 3 Encounters:  02/18/22 250 lb (113.4 kg)  08/08/21 267 lb 12.8 oz (121.5 kg)  09/22/20 259 lb 11.2 oz (117.8 kg)   Hypertension: Bystolic 10 mg daily. Lifestyle modification also discussed at his June visit. Has lost weight as above.  Taking bystolic without new side effects.  Home readings: none recent.  BP Readings from Last 3 Encounters:  02/18/22 128/84  10/31/21 (!) 166/108  08/08/21 (!) 140/92   Lab Results  Component Value Date   CREATININE 1.30 08/08/2021   Hyperlipidemia: No current meds.  Not fasting. Prefers lab only visit.  Has lost weight as above.  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 No pertinent surgical history. No Known Allergies Prior to Admission medications   Medication Sig Start Date End Date Taking? Authorizing Provider  LORazepam (ATIVAN) 2 MG tablet Take 0.5 tablets (1 mg total) by mouth daily as needed for anxiety. 02/14/22  Yes Shade Flood, MD  nebivolol (BYSTOLIC) 10 MG tablet Take 1 tablet (10 mg total) by mouth daily. 02/14/22  Yes Shade Flood, MD  QUEtiapine (SEROQUEL) 25 MG tablet Take 2 tablets (50 mg total) by mouth at bedtime. 02/14/22  Yes Shade Flood, MD   Social History   Socioeconomic History   Marital status: Significant Other    Spouse name: Not on  file   Number of children: 2   Years of education: Not on file   Highest education level: Not on file  Occupational History   Not on file  Tobacco Use   Smoking status: Never   Smokeless tobacco: Never  Vaping Use   Vaping Use: Never used  Substance and Sexual Activity   Alcohol use: Yes    Alcohol/week: 3.0 standard drinks of alcohol    Types: 3 Standard drinks or equivalent per week    Comment: weekends   Drug use: No   Sexual activity: Yes    Partners: Female    Birth control/protection: None  Other Topics Concern   Not on file  Social History Narrative   Not on file   Social Determinants of Health   Financial Resource Strain: Low Risk   (06/16/2018)   Overall Financial Resource Strain (CARDIA)    Difficulty of Paying Living Expenses: Not hard at all  Food Insecurity: No Food Insecurity (06/16/2018)   Hunger Vital Sign    Worried About Running Out of Food in the Last Year: Never true    Ran Out of Food in the Last Year: Never true  Transportation Needs: No Transportation Needs (06/16/2018)   PRAPARE - Hydrologist (Medical): No    Lack of Transportation (Non-Medical): No  Physical Activity: Inactive (06/16/2018)   Exercise Vital Sign    Days of Exercise per Week: 0 days    Minutes of Exercise per Session: 0 min  Stress: Stress Concern Present (06/16/2018)   [place]    Feeling of Stress : Rather much  Social Connections: Moderately Isolated (06/16/2018)   Social Connection and Isolation Panel [NHANES]    Frequency of Communication with Friends and Family: Twice a week    Frequency of Social Gatherings with Friends and Family: Twice a week    Attends Religious Services: Never    Marine scientist or Organizations: No    Attends Archivist Meetings: Never    Marital Status: Divorced  Human resources officer Violence: Not At Risk (06/16/2018)   Humiliation, Afraid, Rape, and Kick questionnaire    Fear of Current or Ex-Partner: No    Emotionally Abused: No    Physically Abused: No    Sexually Abused: No    Review of Systems  Per HPI  Objective:   Vitals:   02/18/22 1439  BP: 128/84  Pulse: 63  Temp: 98.5 F (36.9 C)  SpO2: 95%  Weight: 250 lb (113.4 kg)     Physical Exam Vitals reviewed.  Constitutional:      Appearance: He is well-developed.  HENT:     Head: Normocephalic and atraumatic.  Neck:     Vascular: No carotid bruit or JVD.  Cardiovascular:     Rate and Rhythm: Normal rate and regular rhythm.     Heart sounds: Normal heart sounds. No murmur heard. Pulmonary:     Effort: Pulmonary effort is  normal.     Breath sounds: Normal breath sounds. No rales.  Musculoskeletal:     Right lower leg: No edema.     Left lower leg: No edema.  Skin:    General: Skin is warm and dry.  Neurological:     Mental Status: He is alert and oriented to person, place, and time.  Psychiatric:        Mood and Affect: Mood normal.  Assessment & Plan:  Matthew Hanson is a 47 y.o. male . Hyperlipidemia, unspecified hyperlipidemia type - Plan: Comprehensive metabolic panel, Lipid panel  -Fasting labs ordered, medication adjustments or recommendations based on results.  Essential hypertension - Plan: nebivolol (BYSTOLIC) 10 MG tablet, Comprehensive metabolic panel  -Stable with current dose of Bystolic, check labs, continue same.  Anxiety - Plan: busPIRone (BUSPAR) 7.5 MG tablet  -With insomnia.  Unfortunately significant stressors at home and family recently.   -Discussed counseling, phone numbers provided.   -Various previous medication trials discussed including multiple different SSRIs with either sexual side effects or other side effects.  No improvement with Wellbutrin for anxiety previously.  We discussed trial of BuSpar, he has taken that previously and tolerated, unsure of effectiveness.  Start low-dose and recheck with new PCP in the next 6 weeks.  Recently refilled lorazepam for as needed use with goal of lessening need. Insomnia stable with Seroquel, no changes for now, but could consider trial of low-dose Seroquel as well.  RTC precautions.   Meds ordered this encounter  Medications   nebivolol (BYSTOLIC) 10 MG tablet    Sig: Take 1 tablet (10 mg total) by mouth daily.    Dispense:  90 tablet    Refill:  1   busPIRone (BUSPAR) 7.5 MG tablet    Sig: Take 1 tablet (7.5 mg total) by mouth 2 (two) times daily.    Dispense:  60 tablet    Refill:  1   Patient Instructions  Ok to remain on seroquel for sleep.  See info below on stress and and anxiety management.  Continue  exercise, keep up the good work with weight loss! Ok to continue the lorazepam for now, but as we discussed need to look at other daily med for anxiety with lorazepam only as needed as the goal. Try low dose buspar twice per day, and recheck in 6 weeks with new primary provider to discuss that med and possible dose adjustments. Let me know if there are questions in the meantime. Hang in there.   Here are a few options for counseling:  [facility]:  [place] [phone]   Managing Stress, Adult Feeling a certain amount of stress is normal. Stress helps our body and mind get ready to deal with the demands of life. Stress hormones can motivate you to do well at work and meet your responsibilities. But severe or long-term (chronic) stress can affect your mental and physical health. Chronic stress puts you at higher risk for: Anxiety and depression. Other health problems such as digestive problems, muscle aches, heart disease, high blood pressure, and stroke. What are the causes? Common causes of stress include: Demands from work, such as deadlines, feeling overworked, or having long hours. Pressures at home, such as money issues, disagreements with a spouse, or parenting issues. Pressures from major life changes, such as divorce, moving, loss of a loved one, or chronic illness. You may be at higher risk for stress-related problems if you: Do not get enough sleep. Are in poor health. Do not have emotional support. Have a mental health disorder such as anxiety or depression. How to recognize stress Stress can make you: Have trouble sleeping. Feel sad, anxious, irritable, or overwhelmed. Lose your appetite. Overeat or want to eat unhealthy foods. Want to use drugs or alcohol. Stress can also cause physical symptoms, such as: Sore, tense muscles, especially in the shoulders and neck. Headaches. Trouble breathing. A faster heart  rate. Stomach  pain, nausea, or vomiting. Diarrhea or constipation. Trouble concentrating. Follow these instructions at home: Eating and drinking Eat a healthy diet. This includes: Eating foods that are high in fiber, such as beans, whole grains, and fresh fruits and vegetables. Limiting foods that are high in fat and processed sugars, such as fried or sweet foods. Do not skip meals or overeat. Drink enough fluid to keep your urine pale yellow. Alcohol use Do not drink alcohol if: Your health care provider tells you not to drink. You are pregnant, may be pregnant, or are planning to become pregnant. Drinking alcohol is a way some people try to ease their stress. This can be dangerous, so if you drink alcohol: Limit how much you have to: 0-1 drink a day for women. 0-2 drinks a day for men. Know how much alcohol is in your drink. In the [place] one drink equals one 12 oz bottle of beer (355 mL), one 5 oz glass of wine (148 mL), or one 1 oz glass of hard liquor (44 mL). Activity  Include 30 minutes of exercise in your daily schedule. Exercise is a good stress reducer. Include time in your day for an activity that you find relaxing. Try taking a walk, going on a bike ride, reading a book, or listening to music. Schedule your time in a way that lowers stress, and keep a regular schedule. Focus on doing what is most important to get done. Lifestyle Identify the source of your stress and your reaction to it. See a therapist who can help you change unhelpful reactions. When there are stressful events: Talk about them with family, friends, or coworkers. Try to think realistically about stressful events and not ignore them or overreact. Try to find the positives in a stressful situation and not focus on the negatives. Cut back on responsibilities at work and home, if possible. Ask for help from friends or family members if you need it. Find ways to manage stress, such as: Mindfulness,  meditation, or deep breathing. Yoga or tai chi. Progressive muscle relaxation. Spending time in nature. Doing art, playing music, or reading. Making time for fun activities. Spending time with family and friends. Get support from family, friends, or spiritual resources. General instructions Get enough sleep. Try to go to sleep and get up at about the same time every day. Take over-the-counter and prescription medicines only as told by your health care provider. Do not use any products that contain nicotine or tobacco. These products include cigarettes, chewing tobacco, and vaping devices, such as e-cigarettes. If you need help quitting, ask your health care provider. Do not use drugs or smoke to deal with stress. Keep all follow-up visits. This is important. Where to find support Talk with your health care provider about stress management or finding a support group. Find a therapist to work with you on your stress management techniques. Where to find more information [employer] on Mental Illness: www.nami.org American Psychological Association: TVStereos.ch Contact a health care provider if: Your stress symptoms get worse. You are unable to manage your stress at home. You are struggling to stop using drugs or alcohol. Get help right away if: You may be a danger to yourself or others. You have any thoughts of death or suicide. Get help right awayif you feel like you may hurt yourself or others, or have thoughts about taking your own life. Go to your nearest emergency room or: Call 911. Call the [place] at (248)685-2786 or 988 in  the [place] This is open 24 hours a day. Text the Crisis Text Line at 989-038-7084. Summary Feeling a certain amount of stress is normal, but severe or long-term (chronic) stress can affect your mental and physical health. Chronic stress can put you at higher risk for anxiety, depression, and other health problems such as digestive  problems, muscle aches, heart disease, high blood pressure, and stroke. You may be at higher risk for stress-related problems if you do not get enough sleep, are in poor health, lack emotional support, or have a mental health disorder such as anxiety or depression. Identify the source of your stress and your reaction to it. Try talking about stressful events with family, friends, or coworkers, finding a coping method, or getting support from spiritual resources. If you need more help, talk with your health care provider about finding a support group or a mental health therapist. This information is not intended to replace advice given to you by your health care provider. Make sure you discuss any questions you have with your health care provider. Document Revised: 08/24/2020 Document Reviewed: 08/22/2020 Elsevier Patient Education  [place], Adult After being diagnosed with anxiety, you may be relieved to know why you have felt or behaved a certain way. You may also feel overwhelmed about the treatment ahead and what it will mean for your life. With care and support, you can manage this condition. How to manage lifestyle changes Managing stress and anxiety  Stress is your body's reaction to life changes and events, both good and bad. Most stress will last just a few hours, but stress can be ongoing and can lead to more than just stress. Although stress can play a major role in anxiety, it is not the same as anxiety. Stress is usually caused by something external, such as a deadline, test, or competition. Stress normally passes after the triggering event has ended.  Anxiety is caused by something internal, such as imagining a terrible outcome or worrying that something will go wrong that will devastate you. Anxiety often does not go away even after the triggering event is over, and it can become long-term (chronic) worry. It is important to understand the differences  between stress and anxiety and to manage your stress effectively so that it does not lead to an anxious response. Talk with your health care provider or a counselor to learn more about reducing anxiety and stress. He or she may suggest tension reduction techniques, such as: Music therapy. Spend time creating or listening to music that you enjoy and that inspires you. Mindfulness-based meditation. Practice being aware of your normal breaths while not trying to control your breathing. It can be done while sitting or walking. Centering prayer. This involves focusing on a word, phrase, or sacred image that means something to you and brings you peace. Deep breathing. To do this, expand your stomach and inhale slowly through your nose. Hold your breath for 3-5 seconds. Then exhale slowly, letting your stomach muscles relax. Self-talk. Learn to notice and identify thought patterns that lead to anxiety reactions and change those patterns to thoughts that feel peaceful. Muscle relaxation. Taking time to tense muscles and then relax them. Choose a tension reduction technique that fits your lifestyle and personality. These techniques take time and practice. Set aside 5-15 minutes a day to do them. Therapists can offer counseling and training in these techniques. The training to help with anxiety may be covered by some insurance plans. Other  things you can do to manage stress and anxiety include: Keeping a stress diary. This can help you learn what triggers your reaction and then learn ways to manage your response. Thinking about how you react to certain situations. You may not be able to control everything, but you can control your response. Making time for activities that help you relax and not feeling guilty about spending your time in this way. Doing visual imagery. This involves imagining or creating mental pictures to help you relax. Practicing yoga. Through yoga poses, you can lower tension and promote  relaxation.  Medicines Medicines can help ease symptoms. Medicines for anxiety include: Antidepressant medicines. These are usually prescribed for long-term daily control. Anti-anxiety medicines. These may be added in severe cases, especially when panic attacks occur. Medicines will be prescribed by a health care provider. When used together, medicines, psychotherapy, and tension reduction techniques may be the most effective treatment. Relationships Relationships can play a big part in helping you recover. Try to spend more time connecting with trusted friends and family members. Consider going to couples counseling if you have a partner, taking family education classes, or going to family therapy. Therapy can help you and others better understand your condition. How to recognize changes in your anxiety Everyone responds differently to treatment for anxiety. Recovery from anxiety happens when symptoms decrease and stop interfering with your daily activities at home or work. This may mean that you will start to: Have better concentration and focus. Worry will interfere less in your daily thinking. Sleep better. Be less irritable. Have more energy. Have improved memory. It is also important to recognize when your condition is getting worse. Contact your health care provider if your symptoms interfere with home or work and you feel like your condition is not improving. Follow these instructions at home: Activity Exercise. Adults should do the following: Exercise for at least 150 minutes each week. The exercise should increase your heart rate and make you sweat (moderate-intensity exercise). Strengthening exercises at least twice a week. Get the right amount and quality of sleep. Most adults need 7-9 hours of sleep each night. Lifestyle  Eat a healthy diet that includes plenty of vegetables, fruits, whole grains, low-fat dairy products, and lean protein. Do not eat a lot of foods that are  high in fats, added sugars, or salt (sodium). Make choices that simplify your life. Do not use any products that contain nicotine or tobacco. These products include cigarettes, chewing tobacco, and vaping devices, such as e-cigarettes. If you need help quitting, ask your health care provider. Avoid caffeine, alcohol, and certain over-the-counter cold medicines. These may make you feel worse. Ask your pharmacist which medicines to avoid. General instructions Take over-the-counter and prescription medicines only as told by your health care provider. Keep all follow-up visits. This is important. Where to find support You can get help and support from these sources: Self-help groups. Online and [employer]. A trusted spiritual leader. Couples counseling. Family education classes. Family therapy. Where to find more information You may find that joining a support group helps you deal with your anxiety. The following sources can help you locate counselors or support groups near you: [place]: www.mentalhealthamerica.net Anxiety and Depression Association of [place] (ADAA): https://www.clark.net/ National Alliance on Mental Illness (NAMI): www.nami.org Contact a health care provider if: You have a hard time staying focused or finishing daily tasks. You spend many hours a day feeling worried about everyday life. You become exhausted by worry. You start  to have headaches or frequently feel tense. You develop chronic nausea or diarrhea. Get help right away if: You have a racing heart and shortness of breath. You have thoughts of hurting yourself or others. If you ever feel like you may hurt yourself or others, or have thoughts about taking your own life, get help right away. Go to your nearest emergency department or: Call your local emergency services (911 in the [place] Call a suicide crisis helpline, such as the [place] at 432-688-7897 or 988 in the  [place]. This is open 24 hours a day in the [place] Text the Crisis Text Line at 613 363 1564 (in the [place].). Summary Taking steps to learn and use tension reduction techniques can help calm you and help prevent triggering an anxiety reaction. When used together, medicines, psychotherapy, and tension reduction techniques may be the most effective treatment. Family, friends, and partners can play a big part in supporting you. This information is not intended to replace advice given to you by your health care provider. Make sure you discuss any questions you have with your health care provider. Document Revised: 08/23/2020 Document Reviewed: 05/21/2020 Elsevier Patient Education  [place],   Merri Ray, MD [place] 02/18/22 3:37 PM

## 2022-02-21 ENCOUNTER — Other Ambulatory Visit (INDEPENDENT_AMBULATORY_CARE_PROVIDER_SITE_OTHER): Payer: 59

## 2022-02-21 DIAGNOSIS — E785 Hyperlipidemia, unspecified: Secondary | ICD-10-CM

## 2022-02-21 DIAGNOSIS — I10 Essential (primary) hypertension: Secondary | ICD-10-CM | POA: Diagnosis not present

## 2022-02-21 DIAGNOSIS — R7989 Other specified abnormal findings of blood chemistry: Secondary | ICD-10-CM

## 2022-02-21 LAB — COMPREHENSIVE METABOLIC PANEL
ALT: 63 U/L — ABNORMAL HIGH (ref 0–53)
AST: 41 U/L — ABNORMAL HIGH (ref 0–37)
Albumin: 4.8 g/dL (ref 3.5–5.2)
Alkaline Phosphatase: 74 U/L (ref 39–117)
BUN: 8 mg/dL (ref 6–23)
CO2: 28 mEq/L (ref 19–32)
Calcium: 9.9 mg/dL (ref 8.4–10.5)
Chloride: 99 mEq/L (ref 96–112)
Creatinine, Ser: 1.26 mg/dL (ref 0.40–1.50)
GFR: 68.4 mL/min (ref >60.00)
Glucose, Bld: 103 mg/dL — ABNORMAL HIGH (ref 70–99)
Potassium: 4.2 mEq/L (ref 3.5–5.1)
Sodium: 138 mEq/L (ref 135–145)
Total Bilirubin: 0.8 mg/dL (ref 0.2–1.2)
Total Protein: 7.6 g/dL (ref 6.0–8.3)

## 2022-02-21 LAB — LIPID PANEL
Cholesterol: 251 mg/dL — ABNORMAL HIGH (ref 0–200)
HDL: 52 mg/dL (ref >39.00)
LDL Cholesterol: 170 mg/dL — ABNORMAL HIGH (ref 0–99)
NonHDL: 199.32
Total CHOL/HDL Ratio: 5
Triglycerides: 148 mg/dL (ref 0.0–149.0)
VLDL: 29.6 mg/dL (ref 0.0–40.0)

## 2022-03-04 ENCOUNTER — Other Ambulatory Visit: Payer: Self-pay | Admitting: Family Medicine

## 2022-03-04 DIAGNOSIS — F419 Anxiety disorder, unspecified: Secondary | ICD-10-CM

## 2022-04-02 ENCOUNTER — Ambulatory Visit: Payer: 59 | Admitting: Family Medicine

## 2022-04-02 ENCOUNTER — Encounter: Payer: Self-pay | Admitting: Family Medicine

## 2022-04-02 VITALS — BP 124/78 | HR 54 | Temp 98.4°F | Ht 74.0 in | Wt 247.4 lb

## 2022-04-02 DIAGNOSIS — R7989 Other specified abnormal findings of blood chemistry: Secondary | ICD-10-CM

## 2022-04-02 DIAGNOSIS — F411 Generalized anxiety disorder: Secondary | ICD-10-CM

## 2022-04-02 DIAGNOSIS — I10 Essential (primary) hypertension: Secondary | ICD-10-CM | POA: Diagnosis not present

## 2022-04-02 MED ORDER — BUSPIRONE HCL 10 MG PO TABS: 10.0000 mg | ORAL_TABLET | Freq: Two times a day (BID) | ORAL | 2 refills | Status: DC

## 2022-04-02 NOTE — Patient Instructions (Addendum)
It was a pleasure to meet you and I look forward to taking care of you! -Anxiety: Recommend to DISCONTINUE Buspar 7.51m, 1 tablet twice a day; START taking Buspar 176m 1 tablet twice a day.  -Discussed about counseling. Here are a few resources for counseling. If you have any problems with getting to counseling. Call the office or send me a message through [phone] with Seroquel for insomnia and Bystolic for high blood pressure.  -Placed ordered ultrasound of liver with elevated liver enzymes back in June 2023 and last month.  -Follow up in 2 months or sooner if needed.

## 2022-04-02 NOTE — Progress Notes (Signed)
New Patient Office Visit  Subjective    Patient ID: Matthew Hanson, male    DOB: Aug 16, 1975  Age: 47 y.o. MRN: [id number]  CC:  Chief Complaint  Patient presents with   Establish Care    Pt is here today to EST.Care.     HPI Matthew Hanson presents to establish care with new provider and chronic management.   Anxiety: Patient is taking Buspar 7.40m BID, started at last visit on 02/18/2022. He reports he can not tell difference with starting the Buspar. Prior to last appointment he was only taking Lorazepam 1 mg, once day, 4 times a week as needed for anxiety. However, his anxiety has increased over the last few months. He has normal stress of his 47year old with autism and significant other's daughter having a learning disability. But, increased work stress and fathers cancer has progressed to other locations.  Previously tried multiple different SSRIs and Wellbutrin with side effects.   Insomnia: Patient is taking Seroquel 542mdaily at bedtime with no problems. He reports he is able to sleep with this medication.   HTN: Patient is taking  Bystolic [id number] tablet a day. Denies checking his checking blood pressure at home. Denies chest pain, shortness of breath, headache, dizziness Hanson lightheadedness.   Outpatient Encounter Medications as of 04/02/2022  Medication Sig   busPIRone (BUSPAR) 7.5 MG tablet TAKE 1 TABLET BY MOUTH 2 TIMES DAILY.   nebivolol (BYSTOLIC) 10 MG tablet Take 1 tablet (10 mg total) by mouth daily.   QUEtiapine (SEROQUEL) 25 MG tablet Take 2 tablets (50 mg total) by mouth at bedtime.   LORazepam (ATIVAN) 2 MG tablet Take 0.5 tablets (1 mg total) by mouth daily as needed for anxiety. (Patient not taking: Reported on 04/02/2022)   No facility-administered encounter medications on file as of 04/02/2022.    Past Medical History:  Diagnosis Date   Anxiety    Hypertension     No past surgical history on file.  Family History  Problem Relation Age of Onset    Diabetes Father    Heart disease Father    Hyperlipidemia Father    Prostate cancer Father     Social History   Socioeconomic History   Marital status: Significant Other    Spouse name: Not on file   Number of children: 2   Years of education: Not on file   Highest education level: Not on file  Occupational History   Not on file  Tobacco Use   Smoking status: Never   Smokeless tobacco: Never  Vaping Use   Vaping Use: Never used  Substance and Sexual Activity   Alcohol use: Yes    Alcohol/week: 3.0 standard drinks of alcohol    Types: 3 Standard drinks Hanson equivalent per week    Comment: weekends   Drug use: No   Sexual activity: Yes    Partners: Female    Birth control/protection: None  Other Topics Concern   Not on file  Social History Narrative   Not on file   Social Determinants of Health   Financial Resource Strain: Low Risk  (06/16/2018)   Overall Financial Resource Strain (CARDIA)    Difficulty of Paying Living Expenses: Not hard at all  Food Insecurity: No Food Insecurity (06/16/2018)   Hunger Vital Sign    Worried About Running Out of Food in the Last Year: Never true    Ran Out of Food in the Last Year: Never true  Transportation Needs: No  Transportation Needs (06/16/2018)   PRAPARE - Hydrologist (Medical): No    Lack of Transportation (Non-Medical): No  Physical Activity: Inactive (06/16/2018)   Exercise Vital Sign    Days of Exercise per Week: 0 days    Minutes of Exercise per Session: 0 min  Stress: Stress Concern Present (06/16/2018)   Jack    Feeling of Stress : Rather much  Social Connections: Moderately Isolated (06/16/2018)   Social Connection and Isolation Panel [NHANES]    Frequency of Communication with Friends and Family: Twice a week    Frequency of Social Gatherings with Friends and Family: Twice a week    Attends Religious Services: Never     Marine scientist Hanson Organizations: No    Attends Archivist Meetings: Never    Marital Status: Divorced  Human resources officer Violence: Not At Risk (06/16/2018)   Humiliation, Afraid, Rape, and Kick questionnaire    Fear of Current Hanson Ex-Partner: No    Emotionally Abused: No    Physically Abused: No    Sexually Abused: No    ROS See HPI above     Objective    BP 124/78   Pulse (!) 54   Temp 98.4 F (36.9 C) (Temporal)   Ht 6' 2"$  (1.88 m)   Wt 247 lb 6 oz (112.2 kg)   SpO2 99%   BMI 31.76 kg/m   Physical Exam Vitals reviewed.  Constitutional:      General: He is not in acute distress.    Appearance: Normal appearance. He is not ill-appearing Hanson toxic-appearing.  Eyes:     General:        Right eye: No discharge.        Left eye: No discharge.     Conjunctiva/sclera: Conjunctivae normal.  Cardiovascular:     Rate and Rhythm: Normal rate and regular rhythm.     Heart sounds: Normal heart sounds. No murmur heard.    No friction rub. No gallop.  Pulmonary:     Effort: Pulmonary effort is normal. No respiratory distress.     Breath sounds: Normal breath sounds.  Musculoskeletal:        General: Normal range of motion.  Skin:    General: Skin is warm and dry.  Neurological:     General: No focal deficit present.     Mental Status: He is alert. Mental status is at baseline.  Psychiatric:        Mood and Affect: Mood normal.        Behavior: Behavior normal.        Thought Content: Thought content normal.        Judgment: Judgment normal.      Assessment & Plan:  He declined tetanus vaccine at this visit, but reports he will complete it at his next visit.  Follow up in 2 months Hanson sooner if needed.  Screening for colon cancer  Patient had colonoscopy completed 09/2021 with a negative test. He does not want to pursue a colonoscopy at this time.      Valarie Merino, NP

## 2022-04-02 NOTE — Assessment & Plan Note (Signed)
Patient had elevated LFTs in June 2023 and in January 2024. Reviewed over labs with patient. Ordered ultrasound of liver with two consecutive reading being elevated. Office will call with results of scan when available.

## 2022-04-02 NOTE — Assessment & Plan Note (Addendum)
Discontinued Buspar 7.4m BID. Increased Buspar to 155mBID. Based on previous primary care note, this medication has been effective previously. Lorazepam was not continued during visit. Goal is try to have him on a more long acting medication for anxiety than solely on a benzodiazepine. Also, patient has tried several other long acting antianxiety medications that have not been effective due to side effects. Recommend a psychiatrist for medication management, but patient does not think it is bad enough to see a specialist. Patient does feel it may be beneficial to have a counselor. Provided two resources in his after visit summary to contact. If he has complications with establishing care, informed him to call back to the office or send this provider a MyChart message.  Will reassess in 2 months or sooner if needed.

## 2022-04-02 NOTE — Assessment & Plan Note (Signed)
Chronic. Blood pressure is controlled with taking Bystolic [id number], 1 tablet once a day. Continue medication.

## 2022-04-17 ENCOUNTER — Telehealth: Payer: Self-pay

## 2022-04-17 ENCOUNTER — Ambulatory Visit (HOSPITAL_BASED_OUTPATIENT_CLINIC_OR_DEPARTMENT_OTHER)
Admission: RE | Admit: 2022-04-17 | Discharge: 2022-04-17 | Disposition: A | Discharge status: Home / Self Care | Payer: 59 | Source: Ambulatory Visit | Attending: Family Medicine | Admitting: Family Medicine

## 2022-04-17 DIAGNOSIS — R7989 Other specified abnormal findings of blood chemistry: Secondary | ICD-10-CM | POA: Insufficient documentation

## 2022-04-17 NOTE — Telephone Encounter (Signed)
-----   Message from Evangeline Gula, NP sent at 04/17/2022 11:48 AM EST ----- Liver and gallbladder looks good from ultrasound. That is reassuring.  Will continue to monitor liver enzymes.

## 2022-04-17 NOTE — Progress Notes (Signed)
Called patient and could not leave a message due to the voicemail was full.  [place]  [phone]

## 2022-04-17 NOTE — Telephone Encounter (Signed)
Attempted to reach pt . No answer and VM is full.

## 2022-05-08 ENCOUNTER — Other Ambulatory Visit: Payer: Self-pay | Admitting: Family Medicine

## 2022-05-08 DIAGNOSIS — F32A Depression, unspecified: Secondary | ICD-10-CM

## 2022-05-08 DIAGNOSIS — F410 Panic disorder [episodic paroxysmal anxiety] without agoraphobia: Secondary | ICD-10-CM

## 2022-06-11 ENCOUNTER — Telehealth: Payer: Self-pay

## 2022-06-11 ENCOUNTER — Other Ambulatory Visit: Payer: Self-pay

## 2022-06-11 ENCOUNTER — Ambulatory Visit: Payer: 59 | Admitting: Family Medicine

## 2022-06-11 DIAGNOSIS — F419 Anxiety disorder, unspecified: Secondary | ICD-10-CM

## 2022-06-11 DIAGNOSIS — Z09 Encounter for follow-up examination after completed treatment for conditions other than malignant neoplasm: Secondary | ICD-10-CM

## 2022-06-11 MED ORDER — BUSPIRONE HCL 10 MG PO TABS: 10.0000 mg | ORAL_TABLET | Freq: Two times a day (BID) | ORAL | 1 refills | Status: AC

## 2022-06-11 NOTE — Telephone Encounter (Signed)
Called pt and declined scheduling states he will call back to schedule when he knows

## 2022-08-03 ENCOUNTER — Other Ambulatory Visit: Payer: Self-pay | Admitting: Family Medicine

## 2022-08-03 DIAGNOSIS — F32A Depression, unspecified: Secondary | ICD-10-CM

## 2022-08-03 DIAGNOSIS — F430 Acute stress reaction: Secondary | ICD-10-CM

## 2022-11-03 ENCOUNTER — Other Ambulatory Visit: Payer: Self-pay | Admitting: Family Medicine

## 2022-11-03 DIAGNOSIS — F419 Anxiety disorder, unspecified: Secondary | ICD-10-CM

## 2022-11-03 DIAGNOSIS — F410 Panic disorder [episodic paroxysmal anxiety] without agoraphobia: Secondary | ICD-10-CM

## 2022-12-06 ENCOUNTER — Other Ambulatory Visit: Payer: Self-pay | Admitting: Family Medicine

## 2022-12-06 DIAGNOSIS — I10 Essential (primary) hypertension: Secondary | ICD-10-CM
